# Patient Record
Sex: Female | Born: 1975 | Race: White | Hispanic: No | Smoking: Current every day smoker
Health system: Southern US, Community
[De-identification: ages and names within clinical notes are randomized; demographics above are authoritative.]

## PROBLEM LIST (undated history)

## (undated) DIAGNOSIS — F329 Major depressive disorder, single episode, unspecified: Secondary | ICD-10-CM

## (undated) DIAGNOSIS — F32A Depression, unspecified: Secondary | ICD-10-CM

## (undated) DIAGNOSIS — F4310 Post-traumatic stress disorder, unspecified: Secondary | ICD-10-CM

## (undated) HISTORY — DX: Major depressive disorder, single episode, unspecified: F32.9

## (undated) HISTORY — PX: CARPAL TUNNEL RELEASE: SHX101

## (undated) HISTORY — PX: ABDOMINAL HYSTERECTOMY: SHX81

## (undated) HISTORY — DX: Depression, unspecified: F32.A

## (undated) HISTORY — DX: Post-traumatic stress disorder, unspecified: F43.10

---

## 2000-11-30 ENCOUNTER — Ambulatory Visit (HOSPITAL_COMMUNITY): Admission: RE | Admit: 2000-11-30 | Discharge: 2000-11-30 | Payer: Self-pay | Admitting: Obstetrics and Gynecology

## 2000-11-30 ENCOUNTER — Encounter: Payer: Self-pay | Admitting: Obstetrics and Gynecology

## 2001-01-05 ENCOUNTER — Ambulatory Visit (HOSPITAL_COMMUNITY): Admission: RE | Admit: 2001-01-05 | Discharge: 2001-01-05 | Payer: Self-pay | Admitting: Obstetrics and Gynecology

## 2001-01-05 ENCOUNTER — Encounter: Payer: Self-pay | Admitting: Obstetrics and Gynecology

## 2001-04-20 ENCOUNTER — Emergency Department (HOSPITAL_COMMUNITY): Admission: EM | Admit: 2001-04-20 | Discharge: 2001-04-20 | Payer: Self-pay | Admitting: Emergency Medicine

## 2001-10-01 ENCOUNTER — Observation Stay (HOSPITAL_COMMUNITY): Admission: RE | Admit: 2001-10-01 | Discharge: 2001-10-01 | Payer: Self-pay | Admitting: Obstetrics and Gynecology

## 2002-02-04 ENCOUNTER — Encounter: Payer: Self-pay | Admitting: Obstetrics and Gynecology

## 2002-02-04 ENCOUNTER — Inpatient Hospital Stay (HOSPITAL_COMMUNITY): Admission: RE | Admit: 2002-02-04 | Discharge: 2002-02-05 | Payer: Self-pay | Admitting: Obstetrics and Gynecology

## 2002-08-07 ENCOUNTER — Encounter: Payer: Self-pay | Admitting: Internal Medicine

## 2002-08-07 ENCOUNTER — Ambulatory Visit (HOSPITAL_COMMUNITY): Admission: RE | Admit: 2002-08-07 | Discharge: 2002-08-07 | Payer: Self-pay | Admitting: Internal Medicine

## 2002-08-20 ENCOUNTER — Ambulatory Visit (HOSPITAL_COMMUNITY): Admission: RE | Admit: 2002-08-20 | Discharge: 2002-08-20 | Payer: Self-pay | Admitting: Orthopaedic Surgery

## 2002-08-20 ENCOUNTER — Encounter: Payer: Self-pay | Admitting: Orthopaedic Surgery

## 2002-11-18 ENCOUNTER — Ambulatory Visit: Admission: RE | Admit: 2002-11-18 | Discharge: 2002-11-18 | Payer: Self-pay | Admitting: Orthopedic Surgery

## 2002-11-18 ENCOUNTER — Encounter: Payer: Self-pay | Admitting: Orthopedic Surgery

## 2005-06-20 ENCOUNTER — Emergency Department (HOSPITAL_COMMUNITY): Admission: EM | Admit: 2005-06-20 | Discharge: 2005-06-20 | Payer: Self-pay | Admitting: Emergency Medicine

## 2008-02-11 ENCOUNTER — Emergency Department: Payer: Self-pay | Admitting: Emergency Medicine

## 2008-04-11 ENCOUNTER — Emergency Department: Payer: Self-pay | Admitting: Emergency Medicine

## 2008-04-22 ENCOUNTER — Emergency Department: Payer: Self-pay | Admitting: Emergency Medicine

## 2008-04-28 ENCOUNTER — Ambulatory Visit: Payer: Self-pay | Admitting: Orthopedic Surgery

## 2008-09-16 ENCOUNTER — Ambulatory Visit: Payer: Self-pay | Admitting: Gynecologic Oncology

## 2008-11-17 ENCOUNTER — Emergency Department: Payer: Self-pay | Admitting: Emergency Medicine

## 2009-07-14 ENCOUNTER — Emergency Department: Payer: Self-pay | Admitting: Emergency Medicine

## 2009-07-15 ENCOUNTER — Emergency Department: Payer: Self-pay | Admitting: Emergency Medicine

## 2009-11-20 ENCOUNTER — Emergency Department: Payer: Self-pay | Admitting: Emergency Medicine

## 2009-12-13 ENCOUNTER — Inpatient Hospital Stay: Payer: Self-pay | Admitting: Internal Medicine

## 2009-12-31 ENCOUNTER — Emergency Department: Payer: Self-pay | Admitting: Emergency Medicine

## 2010-01-08 ENCOUNTER — Emergency Department: Payer: Self-pay | Admitting: Emergency Medicine

## 2010-02-09 ENCOUNTER — Emergency Department: Payer: Self-pay | Admitting: Emergency Medicine

## 2010-02-15 ENCOUNTER — Emergency Department: Payer: Self-pay | Admitting: Emergency Medicine

## 2010-03-31 ENCOUNTER — Ambulatory Visit: Payer: Self-pay | Admitting: Internal Medicine

## 2010-04-06 ENCOUNTER — Ambulatory Visit: Payer: Self-pay | Admitting: Internal Medicine

## 2010-05-08 ENCOUNTER — Encounter: Payer: Self-pay | Admitting: Obstetrics & Gynecology

## 2011-06-10 ENCOUNTER — Emergency Department: Payer: Self-pay | Admitting: Emergency Medicine
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
## 2016-07-26 ENCOUNTER — Emergency Department (HOSPITAL_COMMUNITY)
Admission: EM | Admit: 2016-07-26 | Discharge: 2016-07-26 | Disposition: A | Discharge status: Home / Self Care | Payer: Self-pay | Attending: Emergency Medicine | Admitting: Emergency Medicine

## 2016-07-26 ENCOUNTER — Encounter (HOSPITAL_COMMUNITY): Payer: Self-pay | Admitting: Emergency Medicine

## 2016-07-26 DIAGNOSIS — Y92009 Unspecified place in unspecified non-institutional (private) residence as the place of occurrence of the external cause: Secondary | ICD-10-CM | POA: Insufficient documentation

## 2016-07-26 DIAGNOSIS — Y939 Activity, unspecified: Secondary | ICD-10-CM | POA: Insufficient documentation

## 2016-07-26 DIAGNOSIS — Z23 Encounter for immunization: Secondary | ICD-10-CM | POA: Insufficient documentation

## 2016-07-26 DIAGNOSIS — Y999 Unspecified external cause status: Secondary | ICD-10-CM | POA: Insufficient documentation

## 2016-07-26 DIAGNOSIS — T24212A Burn of second degree of left thigh, initial encounter: Secondary | ICD-10-CM | POA: Insufficient documentation

## 2016-07-26 DIAGNOSIS — X150XXA Contact with hot stove (kitchen), initial encounter: Secondary | ICD-10-CM | POA: Insufficient documentation

## 2016-07-26 DIAGNOSIS — F17200 Nicotine dependence, unspecified, uncomplicated: Secondary | ICD-10-CM | POA: Insufficient documentation

## 2016-07-26 DIAGNOSIS — T22232A Burn of second degree of left upper arm, initial encounter: Secondary | ICD-10-CM | POA: Insufficient documentation

## 2016-07-26 MED ORDER — TETANUS-DIPHTH-ACELL PERTUSSIS 5-2.5-18.5 LF-MCG/0.5 IM SUSP
0.5000 mL | Freq: Once | INTRAMUSCULAR | Status: AC
  Administered 2016-07-26: 0.5 mL via INTRAMUSCULAR
  Filled 2016-07-26: qty 0.5

## 2016-07-26 MED ORDER — DOUBLE ANTIBIOTIC 500-10000 UNIT/GM EX OINT
TOPICAL_OINTMENT | Freq: Once | CUTANEOUS | Status: AC
  Administered 2016-07-26: 1 via TOPICAL
  Filled 2016-07-26: qty 8

## 2016-07-26 MED ORDER — OXYCODONE-ACETAMINOPHEN 5-325 MG PO TABS: 1.0000 | ORAL_TABLET | ORAL | 0 refills | Status: DC | PRN

## 2016-07-26 MED ORDER — OXYCODONE-ACETAMINOPHEN 5-325 MG PO TABS
2.0000 | ORAL_TABLET | Freq: Once | ORAL | Status: AC
  Administered 2016-07-26: 2 via ORAL
  Filled 2016-07-26: qty 2

## 2016-07-26 MED ORDER — DOUBLE ANTIBIOTIC 500-10000 UNIT/GM EX OINT: 1.0000 "application " | TOPICAL_OINTMENT | Freq: Two times a day (BID) | CUTANEOUS | 0 refills | Status: DC
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
## 2018-01-15 ENCOUNTER — Emergency Department (HOSPITAL_COMMUNITY): Payer: Self-pay

## 2018-01-15 ENCOUNTER — Encounter (HOSPITAL_COMMUNITY): Payer: Self-pay | Admitting: *Deleted

## 2018-01-15 ENCOUNTER — Emergency Department (HOSPITAL_COMMUNITY)
Admission: EM | Admit: 2018-01-15 | Discharge: 2018-01-16 | Disposition: A | Discharge status: Home / Self Care | Payer: Self-pay | Attending: Emergency Medicine | Admitting: Emergency Medicine

## 2018-01-15 DIAGNOSIS — F17210 Nicotine dependence, cigarettes, uncomplicated: Secondary | ICD-10-CM | POA: Insufficient documentation

## 2018-01-15 DIAGNOSIS — Y998 Other external cause status: Secondary | ICD-10-CM | POA: Insufficient documentation

## 2018-01-15 DIAGNOSIS — W010XXA Fall on same level from slipping, tripping and stumbling without subsequent striking against object, initial encounter: Secondary | ICD-10-CM | POA: Insufficient documentation

## 2018-01-15 DIAGNOSIS — S93602A Unspecified sprain of left foot, initial encounter: Secondary | ICD-10-CM | POA: Insufficient documentation

## 2018-01-15 DIAGNOSIS — Y92019 Unspecified place in single-family (private) house as the place of occurrence of the external cause: Secondary | ICD-10-CM | POA: Insufficient documentation

## 2018-01-15 DIAGNOSIS — Y9301 Activity, walking, marching and hiking: Secondary | ICD-10-CM | POA: Insufficient documentation

## 2018-01-15 DIAGNOSIS — R200 Anesthesia of skin: Secondary | ICD-10-CM | POA: Insufficient documentation

## 2018-01-15 MED ORDER — ONDANSETRON HCL 4 MG PO TABS
4.0000 mg | ORAL_TABLET | Freq: Once | ORAL | Status: AC
  Administered 2018-01-15: 4 mg via ORAL
  Filled 2018-01-15: qty 1

## 2018-01-15 MED ORDER — IBUPROFEN 400 MG PO TABS
400.0000 mg | ORAL_TABLET | Freq: Once | ORAL | Status: AC
  Administered 2018-01-15: 400 mg via ORAL
  Filled 2018-01-15: qty 1

## 2018-01-15 MED ORDER — IBUPROFEN 600 MG PO TABS: 600.0000 mg | ORAL_TABLET | Freq: Four times a day (QID) | ORAL | 0 refills | Status: DC

## 2018-01-15 MED ORDER — HYDROCODONE-ACETAMINOPHEN 5-325 MG PO TABS
2.0000 | ORAL_TABLET | Freq: Once | ORAL | Status: AC
  Administered 2018-01-15: 2 via ORAL
  Filled 2018-01-15: qty 2

## 2018-01-15 MED ORDER — TRAMADOL HCL 50 MG PO TABS: 50.0000 mg | ORAL_TABLET | Freq: Four times a day (QID) | ORAL | 0 refills | Status: DC | PRN
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
## 2018-01-16 NOTE — Discharge Instructions (Addendum)
Your xray is negative for fracture or dislocation. Please see Dr Romeo Apple for evaluation if not improving. Use the  ACE bandage and canvas shoe for the next 5 to 7 days. Use ibuprofen with each meal and bedtime. Use ultram for more severe pain.

## 2018-06-12 ENCOUNTER — Encounter: Payer: Self-pay | Admitting: Family Medicine

## 2018-06-12 ENCOUNTER — Other Ambulatory Visit: Payer: Self-pay

## 2018-06-12 DIAGNOSIS — Z90710 Acquired absence of both cervix and uterus: Secondary | ICD-10-CM | POA: Insufficient documentation

## 2018-06-12 DIAGNOSIS — Z9889 Other specified postprocedural states: Secondary | ICD-10-CM | POA: Insufficient documentation

## 2018-06-12 DIAGNOSIS — Z9882 Breast implant status: Secondary | ICD-10-CM | POA: Insufficient documentation

## 2018-06-12 DIAGNOSIS — Z9851 Tubal ligation status: Secondary | ICD-10-CM | POA: Insufficient documentation

## 2018-06-12 DIAGNOSIS — G43909 Migraine, unspecified, not intractable, without status migrainosus: Secondary | ICD-10-CM | POA: Insufficient documentation

## 2018-06-14 ENCOUNTER — Other Ambulatory Visit: Payer: Self-pay

## 2018-06-14 ENCOUNTER — Ambulatory Visit: Payer: BLUE CROSS/BLUE SHIELD | Admitting: Family Medicine

## 2018-06-14 ENCOUNTER — Encounter: Payer: Self-pay | Admitting: Family Medicine

## 2018-06-14 VITALS — BP 108/72 | HR 65 | Temp 98.0°F | Ht 63.0 in | Wt 144.0 lb

## 2018-06-14 DIAGNOSIS — R5383 Other fatigue: Secondary | ICD-10-CM

## 2018-06-14 DIAGNOSIS — G8929 Other chronic pain: Secondary | ICD-10-CM | POA: Diagnosis not present

## 2018-06-14 DIAGNOSIS — M2550 Pain in unspecified joint: Secondary | ICD-10-CM | POA: Diagnosis not present

## 2018-06-14 DIAGNOSIS — Z7689 Persons encountering health services in other specified circumstances: Secondary | ICD-10-CM | POA: Diagnosis not present

## 2018-06-14 DIAGNOSIS — K047 Periapical abscess without sinus: Secondary | ICD-10-CM

## 2018-06-14 MED ORDER — CLINDAMYCIN HCL 300 MG PO CAPS: 300.0000 mg | ORAL_CAPSULE | Freq: Three times a day (TID) | ORAL | 0 refills | Status: AC

## 2018-06-14 MED ORDER — NAPROXEN 500 MG PO TABS: 500.0000 mg | ORAL_TABLET | Freq: Two times a day (BID) | ORAL | 1 refills | Status: DC

## 2018-06-14 NOTE — Patient Instructions (Signed)
Joint Pain  Joint pain can be caused by many things. It is likely to go away if you follow instructions from your doctor for taking care of yourself at home. Sometimes, you may need more treatment. Follow these instructions at home: Managing pain, stiffness, and swelling   If told, put ice on the painful area. ? Put ice in a plastic bag. ? Place a towel between your skin and the bag. ? Leave the ice on for 20 minutes, 2-3 times a day.  If told, put heat on the painful area. Do this as often as told by your doctor. Use the heat source that your doctor recommends, such as a moist heat pack or a heating pad. ? Place a towel between your skin and the heat source. ? Leave the heat on for 20-30 minutes. ? Take off the heat if your skin gets bright red. This is especially important if you are unable to feel pain, heat, or cold. You may have a greater risk of getting burned.  Move your fingers or toes below the painful joint often. This helps with stiffness and swelling.  If possible, raise (elevate) the painful joint above the level of your heart while you are sitting or lying down. To do this, try putting a few pillows under the painful joint. Activity  Rest the painful joint for as long as told. Do not do things that cause pain or make your pain worse.  Begin exercising or stretching the affected area, as told by your doctor. Ask your doctor what types of exercise are safe for you. If you have an elastic bandage, sling, or splint:  Wear the device as told by your doctor. Take it off only as told by your doctor.  Loosen the device if your fingers or toes below the joint: ? Tingle. ? Lose feeling (get numb). ? Get cold and blue.  Keep the device clean.  Ask your doctor if you should take off the device before bathing. You may need to cover it with a watertight covering when you take a bath or a shower. General instructions  Take over-the-counter and prescription medicines only as told  by your doctor.  Do not use any products that contain nicotine or tobacco. These include cigarettes and e-cigarettes. If you need help quitting, ask your doctor.  Keep all follow-up visits as told by your doctor. This is important. Contact a doctor if:  You have pain that gets worse and does not get better with medicine.  Your joint pain does not get better in 3 days.  You have more bruising or swelling.  You have a fever.  You lose 10 lb (4.5 kg) or more without trying. Get help right away if:  You cannot move the joint.  Your fingers or toes tingle, lose feeling, or get cold and blue.  You have a fever along with a joint that is red, warm, and swollen. Summary  Joint pain can be caused by many things. It often goes away if you follow instructions from your doctor for taking care of yourself at home.  Rest the painful joint for as long as told. Do not do things that cause pain or make your pain worse.  Take over-the-counter and prescription medicines only as told by your doctor. This information is not intended to replace advice given to you by your health care provider. Make sure you discuss any questions you have with your health care provider. Document Released: 03/23/2009 Document Revised: 01/18/2017 Document   Reviewed: 01/18/2017 [employer] Interactive Patient Education  2019 [employer]  

## 2018-06-14 NOTE — Progress Notes (Signed)
Subjective:  Patient ID: Melanie Craig, female    DOB: Jan 02, 1976, 43 y.o.   MRN: [id number]  Chief Complaint:  Establish Care and Pain in joints of legs and hands   HPI: Melanie Craig is a 43 y.o. female presenting on 06/14/2018 for Establish Care and Pain in joints of legs and hands   1. Encounter to establish care  Pt presents today to establish care. Pt has not seen a PCP in 5 years, states last saw Dr. Clemmie Krill in [place]. States she does see psychiatry on a regular basis for her anxiety, depression, and PTSD. States she has been doing fairly well overall. States she is having ongoing issues with multiple joint pain. States this started at least 2-3 years ago. She states she has not seen anyone for this problem.    2. Other fatigue  Daily fatigue, states ongoing for several years. States it has become worse over the last several months. States she feels as if she could sleep all day. She states she sleeps at least 8-9 hours per night and still feels tired.    3. Chronic pain of multiple joints  Pt states she has pain in her knees, fingers, toes, and feet. She states this started at least 2-3 years ago and is getting worse. She has fatigue with the pain. States she does have swelling of her knees and ankles at times, but not always. States she is very stiff upon awakening and the pain increases throughout the day. She denies fever, chills, weakness, loss of function, or weakness. Pt states she has been taking motirn without relief of symptoms.    4. Dental abscess  Pt states she has an abscessed tooth. States this started several days ago and is getting worse. States she has not taken anything for this. States she is planning on seeing her dentist within the next week. Denies fever or chills.       Relevant past medical, surgical, family, and social history reviewed and updated as indicated.  Allergies and medications reviewed and updated.   Past Medical History:  Diagnosis  Date  . Depression   . PTSD (post-traumatic stress disorder)     Past Surgical History:  Procedure Laterality Date  . ABDOMINAL HYSTERECTOMY     complete  . CARPAL TUNNEL RELEASE      Social History   Socioeconomic History  . Marital status: Divorced    Spouse name: Not on file  . Number of children: Not on file  . Years of education: Not on file  . Highest education level: Not on file  Occupational History  . Not on file  Social Needs  . Financial resource strain: Not on file  . Food insecurity:    Worry: Not on file    Inability: Not on file  . Transportation needs:    Medical: Not on file    Non-medical: Not on file  Tobacco Use  . Smoking status: Current Every Day Smoker    Packs/day: 0.50    Years: 20.00    Pack years: 10.00    Types: Cigarettes  . Smokeless tobacco: Never Used  Substance and Sexual Activity  . Alcohol use: No  . Drug use: No  . Sexual activity: Not on file  Lifestyle  . Physical activity:    Days per week: Not on file    Minutes per session: Not on file  . Stress: Not on file  Relationships  . Social connections:  Talks on phone: Not on file    Gets together: Not on file    Attends religious service: Not on file    Active member of club or organization: Not on file    Attends meetings of clubs or organizations: Not on file    Relationship status: Not on file  . Intimate partner violence:    Fear of current or ex partner: Not on file    Emotionally abused: Not on file    Physically abused: Not on file    Forced sexual activity: Not on file  Other Topics Concern  . Not on file  Social History Narrative  . Not on file    Outpatient Encounter Medications as of 06/14/2018  Medication Sig  . ARIPiprazole (ABILIFY) 2 MG tablet Take 2 mg by mouth daily.  Marland Kitchen gabapentin (NEURONTIN) 600 MG tablet Take 600 mg by mouth 4 (four) times daily.  . prazosin (MINIPRESS) 1 MG capsule Take 1 mg by mouth at bedtime.  . [DISCONTINUED] ibuprofen  (ADVIL,MOTRIN) 600 MG tablet Take 1 tablet (600 mg total) by mouth 4 (four) times daily.  . clindamycin (CLEOCIN) 300 MG capsule Take 1 capsule (300 mg total) by mouth 3 (three) times daily for 7 days.  . naproxen (NAPROSYN) 500 MG tablet Take 1 tablet (500 mg total) by mouth 2 (two) times daily with a meal.  . [DISCONTINUED] clonazePAM (KLONOPIN) 0.5 MG tablet Take 0.5 mg by mouth 3 (three) times daily as needed.  . [DISCONTINUED] naproxen (NAPROSYN) 500 MG tablet Take 500 mg by mouth 2 (two) times daily.  . [DISCONTINUED] oxyCODONE-acetaminophen (PERCOCET) 5-325 MG tablet Take 1 tablet by mouth every 4 (four) hours as needed for severe pain.  . [DISCONTINUED] polymixin-bacitracin (POLYSPORIN) 500-10000 UNIT/GM OINT ointment Apply 1 application topically 2 (two) times daily.  . [DISCONTINUED] traMADol (ULTRAM) 50 MG tablet Take 1 tablet (50 mg total) by mouth every 6 (six) hours as needed.   No facility-administered encounter medications on file as of 06/14/2018.     Allergies  Allergen Reactions  . Penicillins     Review of Systems  Constitutional: Positive for activity change, appetite change and fatigue. Negative for chills, fever and unexpected weight change.  HENT: Positive for dental problem.   Eyes: Negative for photophobia and visual disturbance.  Respiratory: Negative for cough, shortness of breath and wheezing.   Cardiovascular: Negative for chest pain and palpitations.  Endocrine: Positive for cold intolerance. Negative for heat intolerance, polydipsia, polyphagia and polyuria.  Musculoskeletal: Positive for arthralgias and joint swelling. Negative for back pain, gait problem, myalgias, neck pain and neck stiffness.  Neurological: Negative for dizziness, tremors, seizures, syncope, facial asymmetry, speech difficulty, weakness, light-headedness, numbness and headaches.  Psychiatric/Behavioral: Negative for confusion.  All other systems reviewed and are negative.        Objective:  BP 108/72   Pulse 65   Temp 98 F (36.7 C) (Oral)   Ht 5' 3" (1.6 m)   Wt 144 lb (65.3 kg)   BMI 25.51 kg/m    Wt Readings from Last 3 Encounters:  06/14/18 144 lb (65.3 kg)  01/15/18 135 lb (61.2 kg)  07/26/16 125 lb (56.7 kg)    Physical Exam Vitals signs and nursing note reviewed.  Constitutional:      General: She is not in acute distress.    Appearance: Normal appearance. She is not ill-appearing or toxic-appearing.  HENT:     Head: Normocephalic and atraumatic.     Jaw: There is  normal jaw occlusion.     Right Ear: Hearing, tympanic membrane, ear canal and external ear normal.     Left Ear: Hearing, tympanic membrane, ear canal and external ear normal.     Nose: Nose normal.     Mouth/Throat:     Mouth: Mucous membranes are moist.     Dentition: Abnormal dentition. Has dentures. Dental tenderness, gingival swelling, dental caries and dental abscesses present.     Tongue: No lesions. Tongue does not protrude in midline.     Pharynx: Oropharynx is clear. No pharyngeal swelling, oropharyngeal exudate, posterior oropharyngeal erythema or uvula swelling.     Tonsils: No tonsillar exudate or tonsillar abscesses.   Eyes:     Extraocular Movements: Extraocular movements intact.     Conjunctiva/sclera: Conjunctivae normal.     Pupils: Pupils are equal, round, and reactive to light.  Musculoskeletal:     Right knee: She exhibits normal range of motion, no swelling, no effusion, no ecchymosis, no deformity, no laceration, no erythema, normal alignment, no LCL laxity and normal patellar mobility. Tenderness found.     Left knee: She exhibits normal range of motion, no swelling, no effusion, no ecchymosis, no deformity, no laceration, no erythema, normal alignment, no LCL laxity, normal patellar mobility, no bony tenderness, normal meniscus and no MCL laxity. Tenderness found. No medial joint line, no lateral joint line, no MCL, no LCL and no patellar tendon  tenderness noted.     Right ankle: Normal.     Left ankle: Normal.  Neurological:     Mental Status: She is alert.  Psychiatric:        Attention and Perception: Attention and perception normal.        Mood and Affect: Mood and affect normal.        Speech: Speech normal.        Behavior: Behavior is cooperative.        Thought Content: Thought content normal.        Cognition and Memory: Cognition and memory normal.        Judgment: Judgment normal.     No results found for this or any previous visit.     Pertinent labs & imaging results that were available during my care of the patient were reviewed by me and considered in my medical decision making.  Assessment & Plan:  Arista was seen today for establish care and pain in joints of legs and hands.  Diagnoses and all orders for this visit:  Encounter to establish care -     HIV Antibody (routine testing w rflx)  Other fatigue Labs pending. Sleep hygiene discussed.  -     CBC with Differential/Platelet -     CMP14+EGFR -     TSH -     VITAMIN D 25 Hydroxy (Vit-D Deficiency, Fractures) -     Vitamin B12 -     Arthritis Panel  Chronic pain of multiple joints Labs pending. Offered PT, declined. Medications as prescribed. Will speak with psychiatry to see if addition of Lyrica or Cymbalta will be beneficial.  -     CBC with Differential/Platelet -     CMP14+EGFR -     VITAMIN D 25 Hydroxy (Vit-D Deficiency, Fractures) -     Vitamin B12 -     Arthritis Panel -     naproxen (NAPROSYN) 500 MG tablet; Take 1 tablet (500 mg total) by mouth 2 (two) times daily with a meal.  Dental abscess Medications as  prescribed. Make a dental appointment ASAP. -     naproxen (NAPROSYN) 500 MG tablet; Take 1 tablet (500 mg total) by mouth 2 (two) times daily with a meal. -     clindamycin (CLEOCIN) 300 MG capsule; Take 1 capsule (300 mg total) by mouth 3 (three) times daily for 7 days.     Continue all other maintenance  medications.  Follow up plan: Return in about 4 weeks (around 07/12/2018), or if symptoms worsen or fail to improve.  Educational handout given for joint pain  The above assessment and management plan was discussed with the patient. The patient verbalized understanding of and has agreed to the management plan. Patient is aware to call the clinic if symptoms persist or worsen. Patient is aware when to return to the clinic for a follow-up visit. Patient educated on when it is appropriate to go to the emergency department.   Monia Pouch, FNP-C [facility] [phone]

## 2018-06-15 ENCOUNTER — Telehealth: Payer: Self-pay | Admitting: Family Medicine

## 2018-06-15 LAB — ARTHRITIS PANEL
BASOS: 1 %
Basophils Absolute: 0 10*3/uL (ref 0.0–0.2)
EOS (ABSOLUTE): 0.2 10*3/uL (ref 0.0–0.4)
EOS: 4 %
HEMATOCRIT: 42.6 % (ref 34.0–46.6)
HEMOGLOBIN: 14.3 g/dL (ref 11.1–15.9)
IMMATURE GRANS (ABS): 0 10*3/uL (ref 0.0–0.1)
Immature Granulocytes: 1 %
LYMPHS ABS: 1.6 10*3/uL (ref 0.7–3.1)
Lymphs: 37 %
MCH: 30.5 pg (ref 26.6–33.0)
MCHC: 33.6 g/dL (ref 31.5–35.7)
MCV: 91 fL (ref 79–97)
MONOCYTES: 7 %
Monocytes Absolute: 0.3 10*3/uL (ref 0.1–0.9)
NEUTROS ABS: 2.2 10*3/uL (ref 1.4–7.0)
Neutrophils: 50 %
Platelets: 203 10*3/uL (ref 150–450)
RBC: 4.69 x10E6/uL (ref 3.77–5.28)
RDW: 13.7 % (ref 11.7–15.4)
Rhuematoid fact SerPl-aCnc: <10 IU/mL (ref 0.0–13.9)
SED RATE: 33 mm/h — AB (ref 0–32)
Uric Acid: 4 mg/dL (ref 2.5–7.1)
WBC: 4.3 10*3/uL (ref 3.4–10.8)

## 2018-06-15 LAB — CMP14+EGFR
ALBUMIN: 4.3 g/dL (ref 3.8–4.8)
ALT: 102 IU/L — ABNORMAL HIGH (ref 0–32)
AST: 52 IU/L — ABNORMAL HIGH (ref 0–40)
Albumin/Globulin Ratio: 1.5 (ref 1.2–2.2)
Alkaline Phosphatase: 211 IU/L — ABNORMAL HIGH (ref 39–117)
BUN / CREAT RATIO: 10 (ref 9–23)
BUN: 10 mg/dL (ref 6–24)
Bilirubin Total: <0.2 mg/dL (ref 0.0–1.2)
CALCIUM: 9.6 mg/dL (ref 8.7–10.2)
CO2: 26 mmol/L (ref 20–29)
Chloride: 101 mmol/L (ref 96–106)
Creatinine, Ser: 0.97 mg/dL (ref 0.57–1.00)
GFR calc Af Amer: 83 mL/min/{1.73_m2} (ref >59)
GFR, EST NON AFRICAN AMERICAN: 72 mL/min/{1.73_m2} (ref >59)
GLOBULIN, TOTAL: 2.9 g/dL (ref 1.5–4.5)
Glucose: 99 mg/dL (ref 65–99)
POTASSIUM: 4.5 mmol/L (ref 3.5–5.2)
Sodium: 142 mmol/L (ref 134–144)
TOTAL PROTEIN: 7.2 g/dL (ref 6.0–8.5)

## 2018-06-15 LAB — VITAMIN B12: Vitamin B-12: 503 pg/mL (ref 232–1245)

## 2018-06-15 LAB — HIV ANTIBODY (ROUTINE TESTING W REFLEX): HIV Screen 4th Generation wRfx: NONREACTIVE

## 2018-06-15 LAB — TSH: TSH: 3.35 u[IU]/mL (ref 0.450–4.500)

## 2018-06-15 LAB — VITAMIN D 25 HYDROXY (VIT D DEFICIENCY, FRACTURES): VIT D 25 HYDROXY: 30 ng/mL (ref 30.0–100.0)

## 2018-06-15 NOTE — Telephone Encounter (Signed)
Patient informed that results have not been reviewed and we will contact her when they have

## 2018-06-18 ENCOUNTER — Telehealth: Payer: Self-pay | Admitting: Family Medicine

## 2018-06-18 NOTE — Telephone Encounter (Signed)
Appt made to be seen on 06/26/2018 with Rakes at 9:20 am to discuss labs and pain

## 2018-06-19 NOTE — Telephone Encounter (Signed)
FYI, explained to patient that lab work was all normal and that you wanted to see her for followup.  Explained we could not call anything in for pain or fatigue until she was seen and evaluated again.

## 2018-06-26 ENCOUNTER — Ambulatory Visit: Payer: BLUE CROSS/BLUE SHIELD | Admitting: Family Medicine

## 2018-06-26 ENCOUNTER — Encounter: Payer: Self-pay | Admitting: Family Medicine

## 2018-06-26 VITALS — BP 112/69 | HR 85 | Temp 98.6°F | Ht 63.0 in | Wt 143.0 lb

## 2018-06-26 DIAGNOSIS — G8929 Other chronic pain: Secondary | ICD-10-CM | POA: Diagnosis not present

## 2018-06-26 DIAGNOSIS — R748 Abnormal levels of other serum enzymes: Secondary | ICD-10-CM

## 2018-06-26 DIAGNOSIS — M2550 Pain in unspecified joint: Secondary | ICD-10-CM

## 2018-06-26 MED ORDER — DULOXETINE HCL 60 MG PO CPEP: 60.0000 mg | ORAL_CAPSULE | Freq: Every day | ORAL | 3 refills | Status: DC

## 2018-06-26 MED ORDER — DULOXETINE HCL 30 MG PO CPEP: 30.0000 mg | ORAL_CAPSULE | Freq: Every day | ORAL | 0 refills | Status: DC

## 2018-06-26 NOTE — Progress Notes (Signed)
Subjective:  Patient ID: Melanie Craig, female    DOB: 01/16/1976, 43 y.o.   MRN: [id number]  Chief Complaint:  Discuss labwork   HPI: Melanie Craig is a 43 y.o. female presenting on 06/26/2018 for Discuss labwork   1. Elevated liver enzymes  PT presents today to discuss lab work. Pts Alk Phos was 211, ALT 102, AST 52. Pt denies abdominal pain, icterus, or confusion. States she continues to have multiple joint pain and is taking Tylenol regularly for the pain. States she does not drink alcohol on a regular basis.   2. Chronic pain of multiple joints  Ongoing for several months. States she spoke with her psychiatrist as discussed and they are ok with Lyrica or Cymbalta for treatment.      Relevant past medical, surgical, family, and social history reviewed and updated as indicated.  Allergies and medications reviewed and updated.   Past Medical History:  Diagnosis Date  . Depression   . PTSD (post-traumatic stress disorder)     Past Surgical History:  Procedure Laterality Date  . ABDOMINAL HYSTERECTOMY     complete  . CARPAL TUNNEL RELEASE      Social History   Socioeconomic History  . Marital status: Divorced    Spouse name: Not on file  . Number of children: Not on file  . Years of education: Not on file  . Highest education level: Not on file  Occupational History  . Not on file  Social Needs  . Financial resource strain: Not on file  . Food insecurity:    Worry: Not on file    Inability: Not on file  . Transportation needs:    Medical: Not on file    Non-medical: Not on file  Tobacco Use  . Smoking status: Current Every Day Smoker    Packs/day: 0.50    Years: 20.00    Pack years: 10.00    Types: Cigarettes  . Smokeless tobacco: Never Used  Substance and Sexual Activity  . Alcohol use: No  . Drug use: No  . Sexual activity: Not on file  Lifestyle  . Physical activity:    Days per week: Not on file    Minutes per session: Not on file  .  Stress: Not on file  Relationships  . Social connections:    Talks on phone: Not on file    Gets together: Not on file    Attends religious service: Not on file    Active member of club or organization: Not on file    Attends meetings of clubs or organizations: Not on file    Relationship status: Not on file  . Intimate partner violence:    Fear of current or ex partner: Not on file    Emotionally abused: Not on file    Physically abused: Not on file    Forced sexual activity: Not on file  Other Topics Concern  . Not on file  Social History Narrative  . Not on file    Outpatient Encounter Medications as of 06/26/2018  Medication Sig  . ARIPiprazole (ABILIFY) 2 MG tablet Take 2 mg by mouth daily.  Marland Kitchen gabapentin (NEURONTIN) 600 MG tablet Take 600 mg by mouth 4 (four) times daily.  . naproxen (NAPROSYN) 500 MG tablet Take 1 tablet (500 mg total) by mouth 2 (two) times daily with a meal.  . prazosin (MINIPRESS) 1 MG capsule Take 1 mg by mouth at bedtime.  . DULoxetine (CYMBALTA) 30  MG capsule Take 1 capsule (30 mg total) by mouth daily for 7 days.  . DULoxetine (CYMBALTA) 60 MG capsule Take 1 capsule (60 mg total) by mouth daily for 30 days.   No facility-administered encounter medications on file as of 06/26/2018.     Allergies  Allergen Reactions  . Penicillins     Review of Systems  Constitutional: Positive for fatigue. Negative for chills, fever and unexpected weight change.  Respiratory: Negative for cough and shortness of breath.   Cardiovascular: Negative for chest pain and palpitations.  Gastrointestinal: Negative for abdominal distention, abdominal pain, nausea and vomiting.  Musculoskeletal: Positive for arthralgias (multiple joints, chronic). Negative for gait problem, joint swelling and myalgias.  Skin: Negative for color change and rash.  Neurological: Negative for weakness and numbness.  Hematological: Negative for adenopathy. Does not bruise/bleed easily.    Psychiatric/Behavioral: Negative for confusion.  All other systems reviewed and are negative.       Objective:  BP 112/69   Pulse 85   Temp 98.6 F (37 C) (Oral)   Ht _0  (1.6 m)   Wt 143 lb (64.9 kg)   BMI 25.33 kg/m    Wt Readings from Last 3 Encounters:  06/26/18 143 lb (64.9 kg)  06/14/18 144 lb (65.3 kg)  01/15/18 135 lb (61.2 kg)    Physical Exam Vitals signs and nursing note reviewed.  Constitutional:      General: She is not in acute distress.    Appearance: Normal appearance. She is well-developed. She is not ill-appearing, toxic-appearing or diaphoretic.  HENT:     Head: Normocephalic and atraumatic.  Eyes:     Conjunctiva/sclera: Conjunctivae normal.     Pupils: Pupils are equal, round, and reactive to light.  Neck:     Musculoskeletal: Normal range of motion and neck supple.     Thyroid: No thyromegaly.     Trachea: No tracheal deviation.  Cardiovascular:     Rate and Rhythm: Normal rate and regular rhythm.     Heart sounds: Normal heart sounds. No murmur. No friction rub. No gallop.   Pulmonary:     Effort: Pulmonary effort is normal. No respiratory distress.     Breath sounds: Normal breath sounds. No wheezing.  Abdominal:     General: Bowel sounds are normal. There is no distension.     Palpations: Abdomen is soft.     Tenderness: There is no abdominal tenderness.  Musculoskeletal: Normal range of motion.  Skin:    General: Skin is warm and dry.     Capillary Refill: Capillary refill takes less than 2 seconds.     Coloration: Skin is not jaundiced.     Findings: No rash.  Neurological:     General: No focal deficit present.     Mental Status: She is alert and oriented to person, place, and time.  Psychiatric:        Behavior: Behavior normal. Behavior is cooperative.        Thought Content: Thought content normal.        Judgment: Judgment normal.     Results for orders placed or performed in visit on 06/14/18  CMP14+EGFR  Result  Value Ref Range   Glucose 99 65 - 99 mg/dL   BUN 10 6 - 24 mg/dL   Creatinine, Ser 0.97 0.57 - 1.00 mg/dL   GFR calc non Af Amer 72 >59 mL/min/1.73   GFR calc Af Amer 83 >59 mL/min/1.73   BUN/Creatinine Ratio 10 9 -  23   Sodium 142 134 - 144 mmol/L   Potassium 4.5 3.5 - 5.2 mmol/L   Chloride 101 96 - 106 mmol/L   CO2 26 20 - 29 mmol/L   Calcium 9.6 8.7 - 10.2 mg/dL   Total Protein 7.2 6.0 - 8.5 g/dL   Albumin 4.3 3.8 - 4.8 g/dL   Globulin, Total 2.9 1.5 - 4.5 g/dL   Albumin/Globulin Ratio 1.5 1.2 - 2.2   Bilirubin Total <0.2 0.0 - 1.2 mg/dL   Alkaline Phosphatase 211 (H) 39 - 117 IU/L   AST 52 (H) 0 - 40 IU/L   ALT 102 (H) 0 - 32 IU/L  TSH  Result Value Ref Range   TSH 3.350 0.450 - 4.500 uIU/mL  VITAMIN D 25 Hydroxy (Vit-D Deficiency, Fractures)  Result Value Ref Range   Vit D, 25-Hydroxy 30.0 30.0 - 100.0 ng/mL  Vitamin B12  Result Value Ref Range   Vitamin B-12 503 232 - 1,245 pg/mL  Arthritis Panel  Result Value Ref Range   Uric Acid 4.0 2.5 - 7.1 mg/dL   Rhuematoid fact SerPl-aCnc <10.0 0.0 - 13.9 IU/mL   WBC 4.3 3.4 - 10.8 x10E3/uL   RBC 4.69 3.77 - 5.28 x10E6/uL   Hemoglobin 14.3 11.1 - 15.9 g/dL   Hematocrit 42.6 34.0 - 46.6 %   MCV 91 79 - 97 fL   MCH 30.5 26.6 - 33.0 pg   MCHC 33.6 31.5 - 35.7 g/dL   RDW 13.7 11.7 - 15.4 %   Platelets 203 150 - 450 x10E3/uL   Neutrophils 50 Not Estab. %   Lymphs 37 Not Estab. %   Monocytes 7 Not Estab. %   Eos 4 Not Estab. %   Basos 1 Not Estab. %   Neutrophils Absolute 2.2 1.4 - 7.0 x10E3/uL   Lymphocytes Absolute 1.6 0.7 - 3.1 x10E3/uL   Monocytes Absolute 0.3 0.1 - 0.9 x10E3/uL   EOS (ABSOLUTE) 0.2 0.0 - 0.4 x10E3/uL   Basophils Absolute 0.0 0.0 - 0.2 x10E3/uL   Immature Granulocytes 1 Not Estab. %   Immature Grans (Abs) 0.0 0.0 - 0.1 x10E3/uL   Sed Rate 33 (H) 0 - 32 mm/hr  HIV Antibody (routine testing w rflx)  Result Value Ref Range   HIV Screen 4th Generation wRfx Non Reactive Non Reactive       Pertinent  labs & imaging results that were available during my care of the patient were reviewed by me and considered in my medical decision making.  Assessment & Plan:  Melanie Craig was seen today for discuss labwork.  Diagnoses and all orders for this visit:  Elevated liver enzymes Fasting labs drawn today. If enzymes remain elevated, will order [place]. -     CMP14+EGFR -     Gamma GT  Chronic pain of multiple joints Limit tylenol intake. Medications as prescribed. Report any new or worsening symptoms.  -     DULoxetine (CYMBALTA) 30 MG capsule; Take 1 capsule (30 mg total) by mouth daily for 7 days. -     DULoxetine (CYMBALTA) 60 MG capsule; Take 1 capsule (60 mg total) by mouth daily for 30 days.     Continue all other maintenance medications.  Follow up plan: Return in about 4 weeks (around 07/24/2018), or if symptoms worsen or fail to improve, for joint pain.  Educational handout given for joint pain  The above assessment and management plan was discussed with the patient. The patient verbalized understanding of and has agreed to the  management plan. Patient is aware to call the clinic if symptoms persist or worsen. Patient is aware when to return to the clinic for a follow-up visit. Patient educated on when it is appropriate to go to the emergency department.   Monia Pouch, FNP-C [facility] [phone]

## 2018-06-26 NOTE — Patient Instructions (Signed)
Joint Pain  Joint pain can be caused by many things. It is likely to go away if you follow instructions from your doctor for taking care of yourself at home. Sometimes, you may need more treatment. Follow these instructions at home: Managing pain, stiffness, and swelling   If told, put ice on the painful area. ? Put ice in a plastic bag. ? Place a towel between your skin and the bag. ? Leave the ice on for 20 minutes, 2-3 times a day.  If told, put heat on the painful area. Do this as often as told by your doctor. Use the heat source that your doctor recommends, such as a moist heat pack or a heating pad. ? Place a towel between your skin and the heat source. ? Leave the heat on for 20-30 minutes. ? Take off the heat if your skin gets bright red. This is especially important if you are unable to feel pain, heat, or cold. You may have a greater risk of getting burned.  Move your fingers or toes below the painful joint often. This helps with stiffness and swelling.  If possible, raise (elevate) the painful joint above the level of your heart while you are sitting or lying down. To do this, try putting a few pillows under the painful joint. Activity  Rest the painful joint for as long as told. Do not do things that cause pain or make your pain worse.  Begin exercising or stretching the affected area, as told by your doctor. Ask your doctor what types of exercise are safe for you. If you have an elastic bandage, sling, or splint:  Wear the device as told by your doctor. Take it off only as told by your doctor.  Loosen the device if your fingers or toes below the joint: ? Tingle. ? Lose feeling (get numb). ? Get cold and blue.  Keep the device clean.  Ask your doctor if you should take off the device before bathing. You may need to cover it with a watertight covering when you take a bath or a shower. General instructions  Take over-the-counter and prescription medicines only as told  by your doctor.  Do not use any products that contain nicotine or tobacco. These include cigarettes and e-cigarettes. If you need help quitting, ask your doctor.  Keep all follow-up visits as told by your doctor. This is important. Contact a doctor if:  You have pain that gets worse and does not get better with medicine.  Your joint pain does not get better in 3 days.  You have more bruising or swelling.  You have a fever.  You lose 10 lb (4.5 kg) or more without trying. Get help right away if:  You cannot move the joint.  Your fingers or toes tingle, lose feeling, or get cold and blue.  You have a fever along with a joint that is red, warm, and swollen. Summary  Joint pain can be caused by many things. It often goes away if you follow instructions from your doctor for taking care of yourself at home.  Rest the painful joint for as long as told. Do not do things that cause pain or make your pain worse.  Take over-the-counter and prescription medicines only as told by your doctor. This information is not intended to replace advice given to you by your health care provider. Make sure you discuss any questions you have with your health care provider. Document Released: 03/23/2009 Document Revised: 01/18/2017 Document   Reviewed: 01/18/2017 [employer] Interactive Patient Education  2019 [employer]  

## 2018-06-27 LAB — CMP14+EGFR
ALT: 73 IU/L — AB (ref 0–32)
AST: 59 IU/L — AB (ref 0–40)
Albumin/Globulin Ratio: 1.6 (ref 1.2–2.2)
Albumin: 4 g/dL (ref 3.8–4.8)
Alkaline Phosphatase: 201 IU/L — ABNORMAL HIGH (ref 39–117)
BUN/Creatinine Ratio: 10 (ref 9–23)
BUN: 11 mg/dL (ref 6–24)
Bilirubin Total: 0.3 mg/dL (ref 0.0–1.2)
CHLORIDE: 99 mmol/L (ref 96–106)
CO2: 24 mmol/L (ref 20–29)
CREATININE: 1.11 mg/dL — AB (ref 0.57–1.00)
Calcium: 9.4 mg/dL (ref 8.7–10.2)
GFR calc non Af Amer: 61 mL/min/{1.73_m2} (ref >59)
GFR, EST AFRICAN AMERICAN: 71 mL/min/{1.73_m2} (ref >59)
Globulin, Total: 2.5 g/dL (ref 1.5–4.5)
Glucose: 87 mg/dL (ref 65–99)
Potassium: 4.2 mmol/L (ref 3.5–5.2)
Sodium: 139 mmol/L (ref 134–144)
TOTAL PROTEIN: 6.5 g/dL (ref 6.0–8.5)

## 2018-06-27 LAB — GAMMA GT: GGT: 68 IU/L — AB (ref 0–60)

## 2018-06-28 ENCOUNTER — Ambulatory Visit: Payer: BLUE CROSS/BLUE SHIELD | Admitting: Family Medicine

## 2018-06-28 NOTE — Addendum Note (Signed)
Addended by: Sonny Masters on: 06/28/2018 12:43 PM   Modules accepted: Orders

## 2018-06-30 ENCOUNTER — Telehealth: Payer: Self-pay | Admitting: Family Medicine

## 2018-06-30 NOTE — Telephone Encounter (Signed)
Patient aware of results and recommendation.

## 2018-07-03 ENCOUNTER — Ambulatory Visit (HOSPITAL_COMMUNITY): Payer: BLUE CROSS/BLUE SHIELD

## 2018-07-04 ENCOUNTER — Ambulatory Visit (INDEPENDENT_AMBULATORY_CARE_PROVIDER_SITE_OTHER): Payer: Self-pay | Admitting: Internal Medicine

## 2018-07-09 ENCOUNTER — Other Ambulatory Visit: Payer: Self-pay

## 2018-07-09 ENCOUNTER — Encounter (INDEPENDENT_AMBULATORY_CARE_PROVIDER_SITE_OTHER): Payer: Self-pay | Admitting: Internal Medicine

## 2018-07-09 ENCOUNTER — Ambulatory Visit (INDEPENDENT_AMBULATORY_CARE_PROVIDER_SITE_OTHER): Payer: BLUE CROSS/BLUE SHIELD | Admitting: Internal Medicine

## 2018-07-12 ENCOUNTER — Ambulatory Visit: Payer: BLUE CROSS/BLUE SHIELD | Admitting: Family Medicine

## 2018-07-23 ENCOUNTER — Ambulatory Visit (INDEPENDENT_AMBULATORY_CARE_PROVIDER_SITE_OTHER): Payer: BLUE CROSS/BLUE SHIELD | Admitting: Family Medicine

## 2018-07-23 ENCOUNTER — Encounter: Payer: Self-pay | Admitting: Family Medicine

## 2018-07-23 ENCOUNTER — Other Ambulatory Visit: Payer: Self-pay

## 2018-07-23 DIAGNOSIS — G8929 Other chronic pain: Secondary | ICD-10-CM | POA: Diagnosis not present

## 2018-07-23 DIAGNOSIS — M2550 Pain in unspecified joint: Secondary | ICD-10-CM | POA: Diagnosis not present

## 2018-07-23 MED ORDER — DULOXETINE HCL 60 MG PO CPEP: 60.0000 mg | ORAL_CAPSULE | Freq: Every day | ORAL | 3 refills | Status: DC

## 2018-07-23 NOTE — Progress Notes (Signed)
Virtual Visit via telephone Note Due to COVID-19, visit is conducted virtually and was requested by patient.  I connected with Melanie Craig on 07/23/18 at 0825 by telephone and verified that I am speaking with the correct person using two identifiers. Melanie Craig is currently located at home and family is currently with them during visit. The provider, Kari Baars, FNP is located in their office at time of visit.  I discussed the limitations, risks, security and privacy concerns of performing an evaluation and management service by telephone and the availability of in person appointments. I also discussed with the patient that there may be a patient responsible charge related to this service. The patient expressed understanding and agreed to proceed.  Subjective:  Patient ID: Melanie Craig, female    DOB: 02-Dec-1975, 43 y.o.   MRN: [id number]  Chief Complaint:  Pain   HPI: Melanie Craig is a 42 y.o. female presenting on 07/23/2018 for Pain   Pt reports mild improvement in joint pain after initiation of Cymbalta. Pt states she took the 30 mg for one week and then increased to 60 mg. States she is tolerating the medication without associated side effects. She states she does still have pain. States it is worse in her legs. The pain is stinging and burning, 8/10 in the mornings and evenings. States the pain is not as bad during the day. She denies other associated symptoms. Denies additional needs or concerns at this time.    Relevant past medical, surgical, family, and social history reviewed and updated as indicated.  Allergies and medications reviewed and updated.   Past Medical History:  Diagnosis Date  . Depression   . PTSD (post-traumatic stress disorder)     Past Surgical History:  Procedure Laterality Date  . ABDOMINAL HYSTERECTOMY     complete  . CARPAL TUNNEL RELEASE      Social History   Socioeconomic History  . Marital status: Divorced    Spouse name:  Not on file  . Number of children: Not on file  . Years of education: Not on file  . Highest education level: Not on file  Occupational History  . Not on file  Social Needs  . Financial resource strain: Not on file  . Food insecurity:    Worry: Not on file    Inability: Not on file  . Transportation needs:    Medical: Not on file    Non-medical: Not on file  Tobacco Use  . Smoking status: Current Every Day Smoker    Packs/day: 0.50    Years: 20.00    Pack years: 10.00    Types: Cigarettes  . Smokeless tobacco: Never Used  Substance and Sexual Activity  . Alcohol use: No  . Drug use: No  . Sexual activity: Not on file  Lifestyle  . Physical activity:    Days per week: Not on file    Minutes per session: Not on file  . Stress: Not on file  Relationships  . Social connections:    Talks on phone: Not on file    Gets together: Not on file    Attends religious service: Not on file    Active member of club or organization: Not on file    Attends meetings of clubs or organizations: Not on file    Relationship status: Not on file  . Intimate partner violence:    Fear of current or ex partner: Not on file    Emotionally  abused: Not on file    Physically abused: Not on file    Forced sexual activity: Not on file  Other Topics Concern  . Not on file  Social History Narrative  . Not on file    Outpatient Encounter Medications as of 07/23/2018  Medication Sig  . ARIPiprazole (ABILIFY) 2 MG tablet Take 2 mg by mouth daily.  . DULoxetine (CYMBALTA) 60 MG capsule Take 1 capsule (60 mg total) by mouth daily for 30 days.  Marland Kitchen gabapentin (NEURONTIN) 600 MG tablet Take 600 mg by mouth 4 (four) times daily.  . hydrOXYzine (VISTARIL) 50 MG capsule Take 50 mg by mouth 3 (three) times daily as needed.  . naproxen (NAPROSYN) 500 MG tablet Take 1 tablet (500 mg total) by mouth 2 (two) times daily with a meal.  . prazosin (MINIPRESS) 1 MG capsule Take 1 mg by mouth at bedtime.  . traZODone  (DESYREL) 50 MG tablet Take 50 mg by mouth. 1-2 tablets at bedtime  . [DISCONTINUED] DULoxetine (CYMBALTA) 30 MG capsule Take 1 capsule (30 mg total) by mouth daily for 7 days.  . [DISCONTINUED] DULoxetine (CYMBALTA) 60 MG capsule Take 1 capsule (60 mg total) by mouth daily for 30 days.   No facility-administered encounter medications on file as of 07/23/2018.     Allergies  Allergen Reactions  . Penicillins     Review of Systems  Constitutional: Negative for activity change, appetite change, chills, fatigue, fever and unexpected weight change.  Respiratory: Negative for cough and shortness of breath.   Cardiovascular: Negative for chest pain, palpitations and leg swelling.  Musculoskeletal: Positive for arthralgias and myalgias. Negative for back pain, gait problem, joint swelling, neck pain and neck stiffness.  Neurological: Negative for dizziness, weakness, light-headedness, numbness and headaches.  Psychiatric/Behavioral: Negative for confusion.  All other systems reviewed and are negative.        Observations/Objective: No vital signs or physical exam, this was a telephone or virtual health encounter.  Pt alert and oriented, answers all questions appropriately, and able to speak in full sentences.    Assessment and Plan: Melanie Craig was seen today for pain.  Diagnoses and all orders for this visit:  Chronic pain of multiple joints Will continue Cymbalta. Report any new or worsening symptoms. Follow up in 3 months.  -     DULoxetine (CYMBALTA) 60 MG capsule; Take 1 capsule (60 mg total) by mouth daily for 30 days.     Follow Up Instructions: Return in about 3 months (around 10/22/2018), or if symptoms worsen or fail to improve, for Chroinc joint pain.    I discussed the assessment and treatment plan with the patient. The patient was provided an opportunity to ask questions and all were answered. The patient agreed with the plan and demonstrated an understanding of the  instructions.   The patient was advised to call back or seek an in-person evaluation if the symptoms worsen or if the condition fails to improve as anticipated.  The above assessment and management plan was discussed with the patient. The patient verbalized understanding of and has agreed to the management plan. Patient is aware to call the clinic if symptoms persist or worsen. Patient is aware when to return to the clinic for a follow-up visit. Patient educated on when it is appropriate to go to the emergency department.    I provided 15 minutes of non-face-to-face time during this encounter. The call started at 0825. The call ended at 0840.   Kari Baars,  FNP-C [facility] [address] [place], [place] [address] [phone]

## 2018-09-21 ENCOUNTER — Other Ambulatory Visit: Payer: Self-pay | Admitting: Family Medicine

## 2018-10-11 ENCOUNTER — Ambulatory Visit (INDEPENDENT_AMBULATORY_CARE_PROVIDER_SITE_OTHER): Payer: BLUE CROSS/BLUE SHIELD | Admitting: Family Medicine

## 2018-10-11 ENCOUNTER — Encounter: Payer: Self-pay | Admitting: Family Medicine

## 2018-10-11 ENCOUNTER — Other Ambulatory Visit: Payer: Self-pay

## 2018-10-11 DIAGNOSIS — R110 Nausea: Secondary | ICD-10-CM

## 2018-10-11 DIAGNOSIS — K0889 Other specified disorders of teeth and supporting structures: Secondary | ICD-10-CM | POA: Diagnosis not present

## 2018-10-11 DIAGNOSIS — K047 Periapical abscess without sinus: Secondary | ICD-10-CM | POA: Diagnosis not present

## 2018-10-11 MED ORDER — ONDANSETRON HCL 4 MG PO TABS: 4.0000 mg | ORAL_TABLET | Freq: Three times a day (TID) | ORAL | 0 refills | Status: DC | PRN

## 2018-10-11 MED ORDER — CLINDAMYCIN HCL 300 MG PO CAPS: 300.0000 mg | ORAL_CAPSULE | Freq: Three times a day (TID) | ORAL | 0 refills | Status: AC

## 2018-10-11 MED ORDER — NAPROXEN 500 MG PO TABS: 500.0000 mg | ORAL_TABLET | Freq: Two times a day (BID) | ORAL | 1 refills | Status: DC

## 2018-10-11 NOTE — Progress Notes (Signed)
Virtual Visit via telephone Note Due to COVID-19, visit is conducted virtually and was requested by patient. This visit type was conducted due to national recommendations for restrictions regarding the COVID-19 Pandemic (e.g. social distancing) in an effort to limit this patient's exposure and mitigate transmission in our community. All issues noted in this document were discussed and addressed.  A physical exam was not performed with this format.   I connected with Melanie Craig on 10/11/18 at 1350 by telephone and verified that I am speaking with the correct person using two identifiers. Melanie Craig is currently located at home and family is currently with them during visit. The provider, Kari BaarsMichelle Rakes, FNP is located in their office at time of visit.  I discussed the limitations, risks, security and privacy concerns of performing an evaluation and management service by telephone and the availability of in person appointments. I also discussed with the patient that there may be a patient responsible charge related to this service. The patient expressed understanding and agreed to proceed.  Subjective:  Patient ID: Melanie Craig, female    DOB: Mar 13, 1976, 43 y.o.   MRN: [id number]  Chief Complaint:  Dental Pain and Facial Swelling   HPI: Melanie Craig is a 43 y.o. female presenting on 10/11/2018 for Dental Pain and Facial Swelling   Pt reports lower left sided dental pain with facial swelling. States she has a cavity and now the tooth has become abscessed. States the pain and swelling started a few days ago. Pt states she has been taking Naproxen with some relief of the pain. States she has drainage from the gum that is causing her to be nauseated. Pt states she has an appointment with her dentist in 2 weeks. States she can not tolerate the pain and needs something for the infection. States the pain is throbbing and 7/10.   Dental Pain  This is a new problem. The current episode  started in the past 7 days. The problem occurs constantly. The problem has been gradually worsening. The pain is at a severity of 7/10. The pain is moderate. Associated symptoms include facial pain and thermal sensitivity. Pertinent negatives include no difficulty swallowing, fever, oral bleeding or sinus pressure. She has tried NSAIDs for the symptoms. The treatment provided mild relief.     Relevant past medical, surgical, family, and social history reviewed and updated as indicated.  Allergies and medications reviewed and updated.   Past Medical History:  Diagnosis Date  . Depression   . PTSD (post-traumatic stress disorder)     Past Surgical History:  Procedure Laterality Date  . ABDOMINAL HYSTERECTOMY     complete  . CARPAL TUNNEL RELEASE      Social History   Socioeconomic History  . Marital status: Divorced    Spouse name: Not on file  . Number of children: Not on file  . Years of education: Not on file  . Highest education level: Not on file  Occupational History  . Not on file  Social Needs  . Financial resource strain: Not on file  . Food insecurity    Worry: Not on file    Inability: Not on file  . Transportation needs    Medical: Not on file    Non-medical: Not on file  Tobacco Use  . Smoking status: Current Every Day Smoker    Packs/day: 0.50    Years: 20.00    Pack years: 10.00    Types: Cigarettes  . Smokeless  tobacco: Never Used  Substance and Sexual Activity  . Alcohol use: No  . Drug use: No  . Sexual activity: Not on file  Lifestyle  . Physical activity    Days per week: Not on file    Minutes per session: Not on file  . Stress: Not on file  Relationships  . Social Herbalist on phone: Not on file    Gets together: Not on file    Attends religious service: Not on file    Active member of club or organization: Not on file    Attends meetings of clubs or organizations: Not on file    Relationship status: Not on file  . Intimate  partner violence    Fear of current or ex partner: Not on file    Emotionally abused: Not on file    Physically abused: Not on file    Forced sexual activity: Not on file  Other Topics Concern  . Not on file  Social History Narrative  . Not on file    Outpatient Encounter Medications as of 10/11/2018  Medication Sig  . amphetamine-dextroamphetamine (ADDERALL) 20 MG tablet   . ARIPiprazole (ABILIFY) 2 MG tablet Take 2 mg by mouth daily.  . buprenorphine (SUBUTEX) 8 MG SUBL SL tablet   . clindamycin (CLEOCIN) 300 MG capsule Take 1 capsule (300 mg total) by mouth 3 (three) times daily for 7 days.  . DULoxetine (CYMBALTA) 60 MG capsule Take 1 capsule (60 mg total) by mouth daily for 30 days.  Marland Kitchen gabapentin (NEURONTIN) 600 MG tablet Take 600 mg by mouth 4 (four) times daily.  . hydrOXYzine (VISTARIL) 50 MG capsule Take 50 mg by mouth 3 (three) times daily as needed.  . naproxen (NAPROSYN) 500 MG tablet Take 1 tablet (500 mg total) by mouth 2 (two) times daily with a meal.  . ondansetron (ZOFRAN) 4 MG tablet Take 1 tablet (4 mg total) by mouth every 8 (eight) hours as needed for nausea or vomiting.  . prazosin (MINIPRESS) 1 MG capsule Take 1 mg by mouth at bedtime.  . traZODone (DESYREL) 50 MG tablet Take 50 mg by mouth. 1-2 tablets at bedtime  . [DISCONTINUED] naproxen (NAPROSYN) 500 MG tablet Take 1 tablet (500 mg total) by mouth 2 (two) times daily with a meal.   No facility-administered encounter medications on file as of 10/11/2018.     Allergies  Allergen Reactions  . Penicillins     Review of Systems  Constitutional: Negative for activity change, appetite change, chills, fatigue, fever and unexpected weight change.  HENT: Positive for dental problem. Negative for sinus pressure.   Respiratory: Negative for shortness of breath.   Cardiovascular: Negative for chest pain and palpitations.  Gastrointestinal: Positive for nausea. Negative for abdominal pain and vomiting.   Neurological: Negative for dizziness, syncope, weakness, light-headedness and headaches.  Psychiatric/Behavioral: Negative for confusion.  All other systems reviewed and are negative.        Observations/Objective: No vital signs or physical exam, this was a telephone or virtual health encounter.  Pt alert and oriented, answers all questions appropriately, and able to speak in full sentences.    Assessment and Plan: Siham was seen today for dental pain and facial swelling.  Diagnoses and all orders for this visit:  Pain, dental Dental pain likely due to dental abscess. Symptomatic care discussed. Avoid hot and cold foods and beverages. Follow up with dentist as soon as possible. Medications as prescribed. Report any new  or worsening symptoms.  -     naproxen (NAPROSYN) 500 MG tablet; Take 1 tablet (500 mg total) by mouth 2 (two) times daily with a meal.  Dental abscess Reported symptoms consistent with dental abscess. Pt has allergy to PCN so will treat with below. Pt aware to take medications with food and to take a probiotic daily. Follow up with dentist as soon as possible.  -     clindamycin (CLEOCIN) 300 MG capsule; Take 1 capsule (300 mg total) by mouth 3 (three) times daily for 7 days.  Nausea in adult Nausea due to drainage from dental abscess. Bland diet. Report any new or worsening symptoms. Medications as prescribed and as needed. Report any new or worsening symptoms.  -     ondansetron (ZOFRAN) 4 MG tablet; Take 1 tablet (4 mg total) by mouth every 8 (eight) hours as needed for nausea or vomiting.     Follow Up Instructions: Return if symptoms worsen or fail to improve.    I discussed the assessment and treatment plan with the patient. The patient was provided an opportunity to ask questions and all were answered. The patient agreed with the plan and demonstrated an understanding of the instructions.   The patient was advised to call back or seek an in-person  evaluation if the symptoms worsen or if the condition fails to improve as anticipated.  The above assessment and management plan was discussed with the patient. The patient verbalized understanding of and has agreed to the management plan. Patient is aware to call the clinic if symptoms persist or worsen. Patient is aware when to return to the clinic for a follow-up visit. Patient educated on when it is appropriate to go to the emergency department.    I provided 15 minutes of non-face-to-face time during this encounter. The call started at 1350. The call ended at 1405. The other time was used for coordination of care.    Kari BaarsMichelle Rakes, FNP-C [facility] [address] [place], [place] [id number] [phone]

## 2018-10-11 NOTE — Patient Instructions (Signed)
See dentist ASAP.  Probiotics while taking antibiotics.

## 2018-11-22 ENCOUNTER — Telehealth: Payer: Self-pay | Admitting: Family Medicine

## 2018-11-22 NOTE — Telephone Encounter (Signed)
Patient states that cymbalta is not helping.  Appt made to revaluted on 8/12

## 2018-11-22 NOTE — Telephone Encounter (Signed)
Sounds good. She probably needs to go to pain management. Ask her if this is something she wants to do?

## 2018-11-28 ENCOUNTER — Encounter: Payer: Self-pay | Admitting: Family Medicine

## 2018-11-28 ENCOUNTER — Ambulatory Visit: Payer: BLUE CROSS/BLUE SHIELD | Admitting: Family Medicine

## 2018-11-28 ENCOUNTER — Other Ambulatory Visit: Payer: Self-pay

## 2018-11-28 VITALS — BP 101/67 | HR 63 | Temp 99.1°F | Ht 63.0 in | Wt 149.0 lb

## 2018-11-28 DIAGNOSIS — R202 Paresthesia of skin: Secondary | ICD-10-CM | POA: Diagnosis not present

## 2018-11-28 DIAGNOSIS — R748 Abnormal levels of other serum enzymes: Secondary | ICD-10-CM

## 2018-11-28 DIAGNOSIS — M2550 Pain in unspecified joint: Secondary | ICD-10-CM

## 2018-11-28 DIAGNOSIS — R5383 Other fatigue: Secondary | ICD-10-CM

## 2018-11-28 DIAGNOSIS — G629 Polyneuropathy, unspecified: Secondary | ICD-10-CM | POA: Insufficient documentation

## 2018-11-28 DIAGNOSIS — G8929 Other chronic pain: Secondary | ICD-10-CM

## 2018-11-28 MED ORDER — DULOXETINE HCL 60 MG PO CPEP: 120.0000 mg | ORAL_CAPSULE | Freq: Every day | ORAL | 3 refills | Status: DC

## 2018-11-28 NOTE — Patient Instructions (Signed)

## 2018-11-28 NOTE — Progress Notes (Signed)
Subjective:  Patient ID: Melanie Craig, female    DOB: 17-Jul-1975, 43 y.o.   MRN: [id number]  Patient Care Team: Baruch Gouty, FNP as PCP - General (Family Medicine)   Chief Complaint:  Medical Management of Chronic Issues (follow up Cymbalta and leg pain and fatigue)   HPI: Melanie Craig is a 43 y.o. female presenting on 11/28/2018 for Medical Management of Chronic Issues (follow up Cymbalta and leg pain and fatigue)   1. Chronic pain of multiple joints   2. Neuropathy   3. Paresthesia of both hands  Ongoing pain of knees, hands, wrists, and back. Does have shooting and burning pain in bilateral upper and lower extremities. Does have numbness and tingling in bilateral hands, no known injuries. Ongoing for several months. States Cymbalta is not helping with the pain and would like to try Lyrica. Discussed this could not be initiated due to other controlled substances and gabapentin use.    4. Elevated liver enzymes  On abdominal pain, confusion, weakness, or icterus.   5. Other fatigue  Ongoing for several months. Not improving despite sleeping at least 8 hours per day.       Relevant past medical, surgical, family, and social history reviewed and updated as indicated.  Allergies and medications reviewed and updated. Date reviewed: Chart in Epic.   Past Medical History:  Diagnosis Date  . Depression   . PTSD (post-traumatic stress disorder)     Past Surgical History:  Procedure Laterality Date  . ABDOMINAL HYSTERECTOMY     complete  . CARPAL TUNNEL RELEASE      Social History   Socioeconomic History  . Marital status: Divorced    Spouse name: Not on file  . Number of children: Not on file  . Years of education: Not on file  . Highest education level: Not on file  Occupational History  . Not on file  Social Needs  . Financial resource strain: Not on file  . Food insecurity    Worry: Not on file    Inability: Not on file  . Transportation needs   Medical: Not on file    Non-medical: Not on file  Tobacco Use  . Smoking status: Current Every Day Smoker    Packs/day: 0.50    Years: 20.00    Pack years: 10.00    Types: Cigarettes  . Smokeless tobacco: Never Used  Substance and Sexual Activity  . Alcohol use: No  . Drug use: No  . Sexual activity: Not on file  Lifestyle  . Physical activity    Days per week: Not on file    Minutes per session: Not on file  . Stress: Not on file  Relationships  . Social Herbalist on phone: Not on file    Gets together: Not on file    Attends religious service: Not on file    Active member of club or organization: Not on file    Attends meetings of clubs or organizations: Not on file    Relationship status: Not on file  . Intimate partner violence    Fear of current or ex partner: Not on file    Emotionally abused: Not on file    Physically abused: Not on file    Forced sexual activity: Not on file  Other Topics Concern  . Not on file  Social History Narrative  . Not on file    Outpatient Encounter Medications as of 11/28/2018  Medication Sig  . amphetamine-dextroamphetamine (ADDERALL) 20 MG tablet Take 20 mg by mouth daily.   . ARIPiprazole (ABILIFY) 5 MG tablet Take 5 mg by mouth daily.  . buprenorphine (SUBUTEX) 8 MG SUBL SL tablet Place 8 mg under the tongue 3 (three) times daily.   Marland Kitchen gabapentin (NEURONTIN) 600 MG tablet Take 600 mg by mouth 4 (four) times daily.  . hydrOXYzine (VISTARIL) 25 MG capsule Take 25 mg by mouth every 8 (eight) hours as needed.  . prazosin (MINIPRESS) 2 MG capsule Take 2 mg by mouth at bedtime.  . traZODone (DESYREL) 50 MG tablet Take 50 mg by mouth. 1-2 tablets at bedtime  . [DISCONTINUED] hydrOXYzine (VISTARIL) 50 MG capsule Take 50 mg by mouth 3 (three) times daily as needed.  . [DISCONTINUED] prazosin (MINIPRESS) 1 MG capsule Take 1 mg by mouth at bedtime.  . DULoxetine (CYMBALTA) 60 MG capsule Take 2 capsules (120 mg total) by mouth daily.   . [DISCONTINUED] ARIPiprazole (ABILIFY) 2 MG tablet Take 2 mg by mouth daily.  . [DISCONTINUED] DULoxetine (CYMBALTA) 60 MG capsule Take 1 capsule (60 mg total) by mouth daily for 30 days.  . [DISCONTINUED] naproxen (NAPROSYN) 500 MG tablet Take 1 tablet (500 mg total) by mouth 2 (two) times daily with a meal.  . [DISCONTINUED] ondansetron (ZOFRAN) 4 MG tablet Take 1 tablet (4 mg total) by mouth every 8 (eight) hours as needed for nausea or vomiting.   No facility-administered encounter medications on file as of 11/28/2018.     Allergies  Allergen Reactions  . Penicillins     Review of Systems  Constitutional: Positive for fatigue. Negative for activity change, appetite change, chills, diaphoresis, fever and unexpected weight change.  HENT: Negative.   Eyes: Negative.  Negative for photophobia and visual disturbance.  Respiratory: Negative for cough, choking, chest tightness, shortness of breath and wheezing.   Cardiovascular: Negative for chest pain, palpitations and leg swelling.  Gastrointestinal: Negative for abdominal distention, abdominal pain, anal bleeding, blood in stool, constipation, diarrhea, nausea, rectal pain and vomiting.  Endocrine: Negative.  Negative for cold intolerance, heat intolerance, polydipsia, polyphagia and polyuria.  Genitourinary: Negative for decreased urine volume, difficulty urinating, dysuria, frequency and urgency.  Musculoskeletal: Positive for arthralgias, back pain and myalgias. Negative for gait problem, joint swelling, neck pain and neck stiffness.  Skin: Negative.  Negative for color change and pallor.  Allergic/Immunologic: Negative.   Neurological: Positive for numbness. Negative for dizziness, tremors, seizures, syncope, facial asymmetry, speech difficulty, weakness, light-headedness and headaches.  Hematological: Negative.  Negative for adenopathy. Does not bruise/bleed easily.  Psychiatric/Behavioral: Negative for agitation, confusion,  hallucinations, sleep disturbance and suicidal ideas.  All other systems reviewed and are negative.       Objective:  BP 101/67   Pulse 63   Temp 99.1 F (37.3 C)   Ht 5' 3"  (1.6 m)   Wt 149 lb (67.6 kg)   BMI 26.39 kg/m    Wt Readings from Last 3 Encounters:  11/28/18 149 lb (67.6 kg)  06/26/18 143 lb (64.9 kg)  06/14/18 144 lb (65.3 kg)    Physical Exam Vitals signs and nursing note reviewed.  Constitutional:      General: She is not in acute distress.    Appearance: Normal appearance. She is well-developed, well-groomed and overweight. She is not ill-appearing, toxic-appearing or diaphoretic.     Comments: Pt appears fatigued during assessment, keeps closing eyes when provider is talking with her.   HENT:  Head: Normocephalic and atraumatic.     Jaw: There is normal jaw occlusion.     Right Ear: Hearing normal.     Left Ear: Hearing normal.     Nose: Nose normal.     Mouth/Throat:     Lips: Pink.     Mouth: Mucous membranes are moist.     Pharynx: Oropharynx is clear. Uvula midline.  Eyes:     General: Lids are normal.     Extraocular Movements: Extraocular movements intact.     Conjunctiva/sclera: Conjunctivae normal.     Pupils: Pupils are equal, round, and reactive to light.  Neck:     Musculoskeletal: Normal range of motion and neck supple.     Thyroid: No thyroid mass, thyromegaly or thyroid tenderness.     Vascular: No carotid bruit or JVD.     Trachea: Trachea and phonation normal.  Cardiovascular:     Rate and Rhythm: Normal rate and regular rhythm.     Chest Wall: PMI is not displaced.     Pulses: Normal pulses.     Heart sounds: Normal heart sounds. No murmur. No friction rub. No gallop.   Pulmonary:     Effort: Pulmonary effort is normal. No respiratory distress.     Breath sounds: Normal breath sounds. No wheezing.  Abdominal:     General: Bowel sounds are normal. There is no distension or abdominal bruit.     Palpations: Abdomen is soft.  There is no hepatomegaly or splenomegaly.     Tenderness: There is no abdominal tenderness. There is no right CVA tenderness or left CVA tenderness.     Hernia: No hernia is present.  Musculoskeletal: Normal range of motion.        General: No swelling, tenderness, deformity or signs of injury.     Right lower leg: No edema.     Left lower leg: No edema.  Lymphadenopathy:     Cervical: No cervical adenopathy.  Skin:    General: Skin is warm and dry.     Capillary Refill: Capillary refill takes less than 2 seconds.     Coloration: Skin is not cyanotic, jaundiced or pale.     Findings: No rash.  Neurological:     General: No focal deficit present.     Mental Status: She is alert and oriented to person, place, and time.     Cranial Nerves: Cranial nerves are intact. No cranial nerve deficit.     Sensory: Sensation is intact. No sensory deficit.     Motor: Motor function is intact. No weakness.     Coordination: Coordination is intact. Coordination normal.     Gait: Gait is intact. Gait normal.     Deep Tendon Reflexes: Reflexes are normal and symmetric. Reflexes normal.  Psychiatric:        Attention and Perception: Attention and perception normal.        Mood and Affect: Mood and affect normal.        Speech: Speech is delayed.        Behavior: Behavior normal. Behavior is cooperative.        Thought Content: Thought content normal.        Cognition and Memory: Cognition and memory normal.        Judgment: Judgment normal.     Results for orders placed or performed in visit on 06/26/18  CMP14+EGFR  Result Value Ref Range   Glucose 87 65 - 99 mg/dL   BUN 11 6 -  24 mg/dL   Creatinine, Ser 1.11 (H) 0.57 - 1.00 mg/dL   GFR calc non Af Amer 61 >59 mL/min/1.73   GFR calc Af Amer 71 >59 mL/min/1.73   BUN/Creatinine Ratio 10 9 - 23   Sodium 139 134 - 144 mmol/L   Potassium 4.2 3.5 - 5.2 mmol/L   Chloride 99 96 - 106 mmol/L   CO2 24 20 - 29 mmol/L   Calcium 9.4 8.7 - 10.2 mg/dL    Total Protein 6.5 6.0 - 8.5 g/dL   Albumin 4.0 3.8 - 4.8 g/dL   Globulin, Total 2.5 1.5 - 4.5 g/dL   Albumin/Globulin Ratio 1.6 1.2 - 2.2   Bilirubin Total 0.3 0.0 - 1.2 mg/dL   Alkaline Phosphatase 201 (H) 39 - 117 IU/L   AST 59 (H) 0 - 40 IU/L   ALT 73 (H) 0 - 32 IU/L  Gamma GT  Result Value Ref Range   GGT 68 (H) 0 - 60 IU/L       Pertinent labs & imaging results that were available during my care of the patient were reviewed by me and considered in my medical decision making.  Assessment & Plan:  Aleiah was seen today for medical management of chronic issues.  Diagnoses and all orders for this visit:  Chronic pain of multiple joints Neuropathy Paresthesia of both hands Ongoing pain with Cymbalta 60 mg daily. Pt requesting Lyrica. Pt aware she was on gabapentin, adderall, and subutex. Pt aware Lyrica can not be prescribed for concurrent use with the above medications. Due to ongoing and worsening symptoms, will increase Cymbalta and refer to neurology for further evaluation.  -     DULoxetine (CYMBALTA) 60 MG capsule; Take 2 capsules (120 mg total) by mouth daily. -     Ambulatory referral to Neurology  Elevated liver enzymes Will recheck labs today. Avoid excessive tylenol and alcohol.  -     CMP14+EGFR  Other fatigue Ongoing symptoms, will recheck labs today.  -     CMP14+EGFR -     Thyroid Panel With TSH -     CBC with Differential/Platelet     Continue all other maintenance medications.  Follow up plan: Return if symptoms worsen or fail to improve.  Continue healthy lifestyle choices, including diet (rich in fruits, vegetables, and lean proteins, and low in salt and simple carbohydrates) and exercise (at least 30 minutes of moderate physical activity daily).  Educational handout given for chronic pain   The above assessment and management plan was discussed with the patient. The patient verbalized understanding of and has agreed to the management plan. Patient  is aware to call the clinic if symptoms persist or worsen. Patient is aware when to return to the clinic for a follow-up visit. Patient educated on when it is appropriate to go to the emergency department.   Monia Pouch, FNP-C [facility] [phone] 11/28/18

## 2018-11-29 LAB — CMP14+EGFR
ALT: 16 IU/L (ref 0–32)
AST: 18 IU/L (ref 0–40)
Albumin/Globulin Ratio: 1.5 (ref 1.2–2.2)
Albumin: 3.8 g/dL (ref 3.8–4.8)
Alkaline Phosphatase: 109 IU/L (ref 39–117)
BUN/Creatinine Ratio: 13 (ref 9–23)
BUN: 12 mg/dL (ref 6–24)
Bilirubin Total: <0.2 mg/dL (ref 0.0–1.2)
CO2: 27 mmol/L (ref 20–29)
Calcium: 9.2 mg/dL (ref 8.7–10.2)
Chloride: 100 mmol/L (ref 96–106)
Creatinine, Ser: 0.93 mg/dL (ref 0.57–1.00)
GFR calc Af Amer: 87 mL/min/{1.73_m2} (ref >59)
GFR calc non Af Amer: 76 mL/min/{1.73_m2} (ref >59)
Globulin, Total: 2.6 g/dL (ref 1.5–4.5)
Glucose: 103 mg/dL — ABNORMAL HIGH (ref 65–99)
Potassium: 4.1 mmol/L (ref 3.5–5.2)
Sodium: 140 mmol/L (ref 134–144)
Total Protein: 6.4 g/dL (ref 6.0–8.5)

## 2018-11-29 LAB — CBC WITH DIFFERENTIAL/PLATELET
Basophils Absolute: 0 10*3/uL (ref 0.0–0.2)
Basos: 1 %
EOS (ABSOLUTE): 0.2 10*3/uL (ref 0.0–0.4)
Eos: 6 %
Hematocrit: 36.3 % (ref 34.0–46.6)
Hemoglobin: 12.3 g/dL (ref 11.1–15.9)
Immature Grans (Abs): 0 10*3/uL (ref 0.0–0.1)
Immature Granulocytes: 0 %
Lymphocytes Absolute: 1.5 10*3/uL (ref 0.7–3.1)
Lymphs: 40 %
MCH: 30.6 pg (ref 26.6–33.0)
MCHC: 33.9 g/dL (ref 31.5–35.7)
MCV: 90 fL (ref 79–97)
Monocytes Absolute: 0.3 10*3/uL (ref 0.1–0.9)
Monocytes: 9 %
Neutrophils Absolute: 1.6 10*3/uL (ref 1.4–7.0)
Neutrophils: 44 %
Platelets: 185 10*3/uL (ref 150–450)
RBC: 4.02 x10E6/uL (ref 3.77–5.28)
RDW: 13.2 % (ref 11.7–15.4)
WBC: 3.7 10*3/uL (ref 3.4–10.8)

## 2018-11-29 LAB — THYROID PANEL WITH TSH
Free Thyroxine Index: 1.9 (ref 1.2–4.9)
T3 Uptake Ratio: 27 % (ref 24–39)
T4, Total: 7 ug/dL (ref 4.5–12.0)
TSH: 2.72 u[IU]/mL (ref 0.450–4.500)

## 2018-12-10 ENCOUNTER — Other Ambulatory Visit: Payer: Self-pay | Admitting: Family Medicine

## 2018-12-10 DIAGNOSIS — K0889 Other specified disorders of teeth and supporting structures: Secondary | ICD-10-CM

## 2019-01-08 ENCOUNTER — Telehealth: Payer: Self-pay | Admitting: *Deleted

## 2019-01-08 ENCOUNTER — Encounter

## 2019-01-08 ENCOUNTER — Encounter: Payer: Self-pay | Admitting: Neurology

## 2019-01-08 ENCOUNTER — Ambulatory Visit: Payer: BLUE CROSS/BLUE SHIELD | Admitting: Neurology

## 2019-01-08 NOTE — Telephone Encounter (Signed)
No showed new patient appointment. 

## 2019-01-17 IMAGING — DX DG FOOT COMPLETE 3+V*L*
3 series · 3 of 3 positions shown · non-contrast
Comparison: None.

CLINICAL DATA: Pain, bruising, swelling, status post fall

EXAM:
LEFT FOOT - COMPLETE 3+ VIEW

[foot ap]
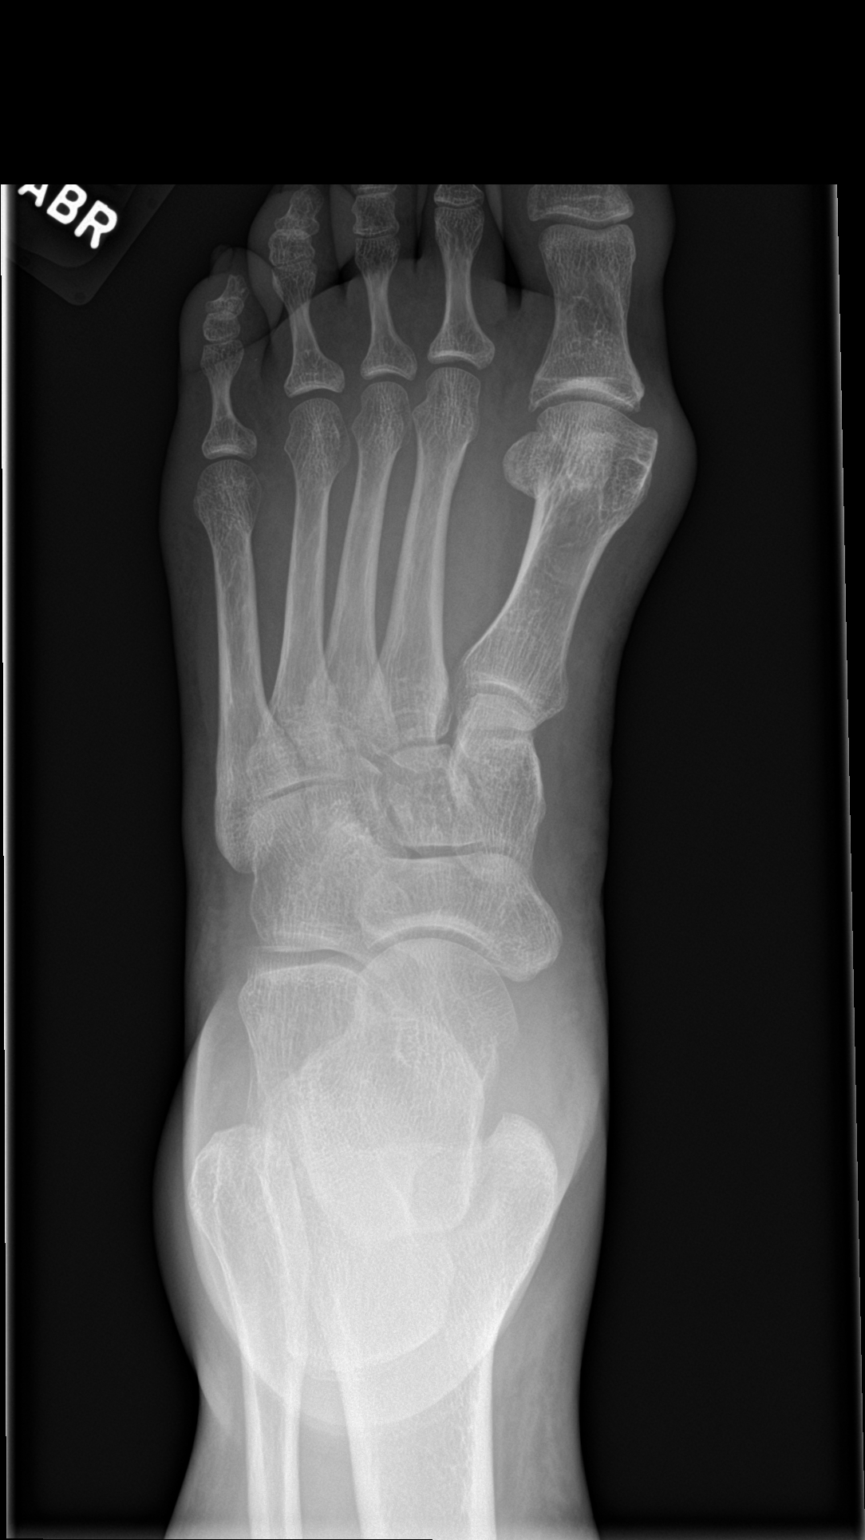

[foot obl]
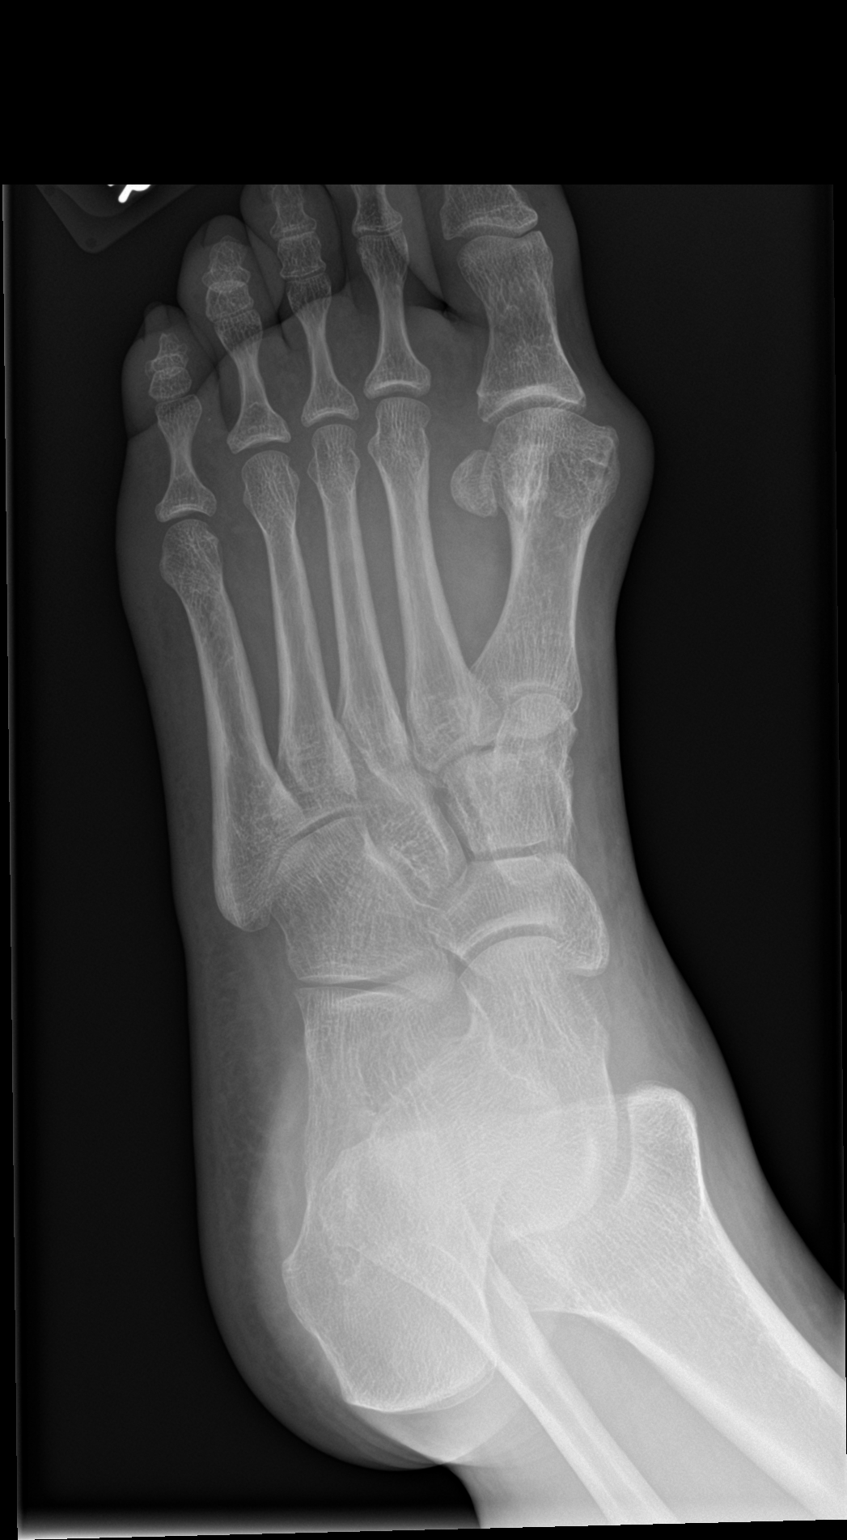

[foot lat]
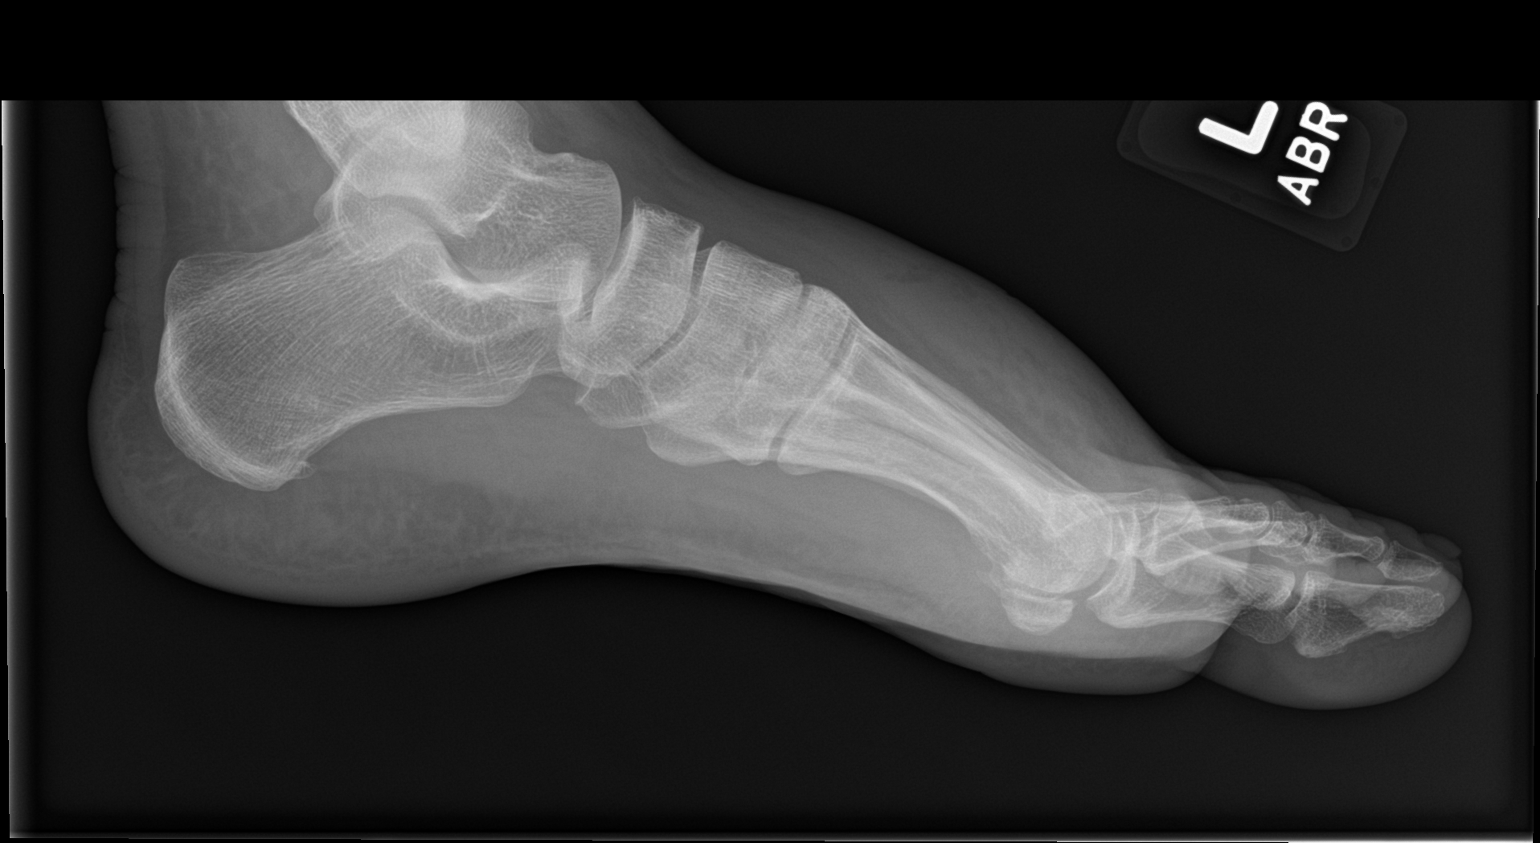

[3 of 3 positions shown; findings below may reference images not displayed]

FINDINGS: No fracture or dislocation is seen.

Mild degenerative changes the 1st MTP joint.

Moderate soft tissue swelling overlying the dorsal forefoot.
IMPRESSION: No fracture or dislocation is seen.

Moderate dorsal soft tissue swelling.

## 2019-05-07 ENCOUNTER — Other Ambulatory Visit: Payer: Self-pay | Admitting: Family Medicine

## 2019-05-07 DIAGNOSIS — R202 Paresthesia of skin: Secondary | ICD-10-CM

## 2019-05-07 DIAGNOSIS — G629 Polyneuropathy, unspecified: Secondary | ICD-10-CM

## 2019-05-07 DIAGNOSIS — G8929 Other chronic pain: Secondary | ICD-10-CM

## 2019-05-07 DIAGNOSIS — M2550 Pain in unspecified joint: Secondary | ICD-10-CM

## 2019-05-28 ENCOUNTER — Ambulatory Visit (INDEPENDENT_AMBULATORY_CARE_PROVIDER_SITE_OTHER): Payer: Self-pay | Admitting: Family Medicine

## 2019-05-28 ENCOUNTER — Encounter: Payer: Self-pay | Admitting: Family Medicine

## 2019-05-28 DIAGNOSIS — K529 Noninfective gastroenteritis and colitis, unspecified: Secondary | ICD-10-CM

## 2019-05-28 MED ORDER — ONDANSETRON 4 MG PO TBDP: 4.0000 mg | ORAL_TABLET | Freq: Three times a day (TID) | ORAL | 0 refills | Status: DC | PRN

## 2019-05-28 NOTE — Patient Instructions (Signed)
Food Choices to Help Relieve Diarrhea, Adult When you have diarrhea, the foods you eat and your eating habits are very important. Choosing the right foods and drinks can help:  Relieve diarrhea.  Replace lost fluids and nutrients.  Prevent dehydration. What general guidelines should I follow?  Relieving diarrhea  Choose foods with less than 2 g or .07 oz. of fiber per serving.  Limit fats to less than 8 tsp (38 g or 1.34 oz.) a day.  Avoid the following: ? Foods and beverages sweetened with high-fructose corn syrup, honey, or sugar alcohols such as xylitol, sorbitol, and mannitol. ? Foods that contain a lot of fat or sugar. ? Fried, greasy, or spicy foods. ? High-fiber grains, breads, and cereals. ? Raw fruits and vegetables.  Eat foods that are rich in probiotics. These foods include dairy products such as yogurt and fermented milk products. They help increase healthy bacteria in the stomach and intestines (gastrointestinal tract, or GI tract).  If you have lactose intolerance, avoid dairy products. These may make your diarrhea worse.  Take medicine to help stop diarrhea (antidiarrheal medicine) only as told by your health care provider. Replacing nutrients  Eat small meals or snacks every 3-4 hours.  Eat bland foods, such as white rice, toast, or baked potato, until your diarrhea starts to get better. Gradually reintroduce nutrient-rich foods as tolerated or as told by your health care provider. This includes: ? Well-cooked protein foods. ? Peeled, seeded, and soft-cooked fruits and vegetables. ? Low-fat dairy products.  Take vitamin and mineral supplements as told by your health care provider. Preventing dehydration  Start by sipping water or a special solution to prevent dehydration (oral rehydration solution, ORS). Urine that is clear or pale yellow means that you are getting enough fluid.  Try to drink at least 8-10 cups of fluid each day to help replace lost  fluids.  You may add other liquids in addition to water, such as clear juice or decaffeinated sports drinks, as tolerated or as told by your health care provider.  Avoid drinks with caffeine, such as coffee, tea, or soft drinks.  Avoid alcohol. What foods are recommended?     The items listed may not be a complete list. Talk with your health care provider about what dietary choices are best for you. Grains White rice. White, French, or pita breads (fresh or toasted), including plain rolls, buns, or bagels. White pasta. Saltine, soda, or graham crackers. Pretzels. Low-fiber cereal. Cooked cereals made with water (such as cornmeal, farina, or cream cereals). Plain muffins. Matzo. Melba toast. Zwieback. Vegetables Potatoes (without the skin). Most well-cooked and canned vegetables without skins or seeds. Tender lettuce. Fruits Apple sauce. Fruits canned in juice. Cooked apricots, cherries, grapefruit, peaches, pears, or plums. Fresh bananas and cantaloupe. Meats and other protein foods Baked or boiled chicken. Eggs. Tofu. Fish. Seafood. Smooth nut butters. Ground or well-cooked tender beef, ham, veal, lamb, pork, or poultry. Dairy Plain yogurt, kefir, and unsweetened liquid yogurt. Lactose-free milk, buttermilk, skim milk, or soy milk. Low-fat or nonfat hard cheese. Beverages Water. Low-calorie sports drinks. Fruit juices without pulp. Strained tomato and vegetable juices. Decaffeinated teas. Sugar-free beverages not sweetened with sugar alcohols. Oral rehydration solutions, if approved by your health care provider. Seasoning and other foods Bouillon, broth, or soups made from recommended foods. What foods are not recommended? The items listed may not be a complete list. Talk with your health care provider about what dietary choices are best for you. Grains Whole   grain, whole wheat, bran, or rye breads, rolls, pastas, and crackers. Wild or brown rice. Whole grain or bran cereals. Barley.  Oats and oatmeal. Corn tortillas or taco shells. Granola. Popcorn. Vegetables Raw vegetables. Fried vegetables. Cabbage, broccoli, Brussels sprouts, artichokes, baked beans, beet greens, corn, kale, legumes, peas, sweet potatoes, and yams. Potato skins. Cooked spinach and cabbage. Fruits Dried fruit, including raisins and dates. Raw fruits. Stewed or dried prunes. Canned fruits with syrup. Meat and other protein foods Fried or fatty meats. Deli meats. Chunky nut butters. Nuts and seeds. Beans and lentils. Bacon. Hot dogs. Sausage. Dairy High-fat cheeses. Whole milk, chocolate milk, and beverages made with milk, such as milk shakes. Half-and-half. Cream. sour cream. Ice cream. Beverages Caffeinated beverages (such as coffee, tea, soda, or energy drinks). Alcoholic beverages. Fruit juices with pulp. Prune juice. Soft drinks sweetened with high-fructose corn syrup or sugar alcohols. High-calorie sports drinks. Fats and oils Butter. Cream sauces. Margarine. Salad oils. Plain salad dressings. Olives. Avocados. Mayonnaise. Sweets and desserts Sweet rolls, doughnuts, and sweet breads. Sugar-free desserts sweetened with sugar alcohols such as xylitol and sorbitol. Seasoning and other foods Honey. Hot sauce. Chili powder. Gravy. Cream-based or milk-based soups. Pancakes and waffles. Summary  When you have diarrhea, the foods you eat and your eating habits are very important.  Make sure you get at least 8-10 cups of fluid each day, or enough to keep your urine clear or pale yellow.  Eat bland foods and gradually reintroduce healthy, nutrient-rich foods as tolerated, or as told by your health care provider.  Avoid high-fiber, fried, greasy, or spicy foods. This information is not intended to replace advice given to you by your health care provider. Make sure you discuss any questions you have with your health care provider. Document Revised: 07/26/2018 Document Reviewed: 04/01/2016 Elsevier Patient  Education  2020 [employer]  

## 2019-05-28 NOTE — Progress Notes (Signed)
Telephone visit  Subjective: CC: GI illness PCP: Sonny Masters, FNP Melanie Craig is a 44 y.o. female calls for telephone consult today. Patient provides verbal consent for consult held via phone.  Due to COVID-19 pandemic this visit was conducted virtually. This visit type was conducted due to national recommendations for restrictions regarding the COVID-19 Pandemic (e.g. social distancing, sheltering in place) in an effort to limit this patient's exposure and mitigate transmission in our community. All issues noted in this document were discussed and addressed.  A physical exam was not performed with this format.   Location of patient: home Location of provider: Working remotely from home Others present for call: none  1. Nausea, vomiting She reports onset nausea and vomiting yesterday evening.  She reports sweating.  She reports fatigue.  She reports non-bloody diarrhea. She denies consumption undercooked food, untreated water.  She ate out a few times before it started but food seemed fine.  No known sick contacts or other sick persons who consumed the meals.  She denies fevers.  Denies cough, congestion, rhinorrhea.  She had a COVID vaccine last month. She was supposed to get the second shot today.  No LMP recorded. Patient has had a hysterectomy.  ROS: Per HPI  Allergies  Allergen Reactions  . Penicillins    Past Medical History:  Diagnosis Date  . Depression   . PTSD (post-traumatic stress disorder)     Current Outpatient Medications:  .  amphetamine-dextroamphetamine (ADDERALL) 20 MG tablet, Take 20 mg by mouth daily. , Disp: , Rfl:  .  ARIPiprazole (ABILIFY) 5 MG tablet, Take 5 mg by mouth daily., Disp: , Rfl:  .  buprenorphine (SUBUTEX) 8 MG SUBL SL tablet, Place 8 mg under the tongue 3 (three) times daily. , Disp: , Rfl:  .  DULoxetine (CYMBALTA) 60 MG capsule, TAKE TWO CAPSULES BY MOUTH EVERY DAY, Disp: 60 capsule, Rfl: 3 .  gabapentin (NEURONTIN) 600 MG tablet,  Take 600 mg by mouth 4 (four) times daily., Disp: , Rfl:  .  hydrOXYzine (VISTARIL) 25 MG capsule, Take 25 mg by mouth every 8 (eight) hours as needed., Disp: , Rfl:  .  naproxen (NAPROSYN) 500 MG tablet, TAKE 1 TABLET BY MOUTH TWICE DAILY with a meal, Disp: 60 tablet, Rfl: 1 .  prazosin (MINIPRESS) 2 MG capsule, Take 2 mg by mouth at bedtime., Disp: , Rfl:  .  traZODone (DESYREL) 50 MG tablet, Take 50 mg by mouth. 1-2 tablets at bedtime, Disp: , Rfl:   Assessment/ Plan: 44 y.o. female   1. Gastroenteritis Likely viral.  I did reiterate that she should consider getting evaluated for COVID-19 as the GI symptoms she experiencing can present as the only symptoms in certain cases.  We discussed adequate hydration.  Zofran sent in for nausea and vomiting.  Discussed red flag signs and symptoms warranting further evaluation emergency department. - ondansetron (ZOFRAN ODT) 4 MG disintegrating tablet; Take 1 tablet (4 mg total) by mouth every 8 (eight) hours as needed for nausea or vomiting.  Dispense: 20 tablet; Refill: 0  Additionally, patient notes ongoing myalgia and arthralgia despite Cymbalta use.  She was asking for recommendations with regards to this and I will defer this to her PCP.  Start time: 4:08pm End time: 4:15pm  Total time spent on patient care (including telephone call/ virtual visit): 18 minutes  Melanie Craig Hulen Skains, DO [facility] [phone]

## 2019-06-12 ENCOUNTER — Encounter: Payer: Self-pay | Admitting: Family Medicine

## 2019-06-12 ENCOUNTER — Ambulatory Visit (INDEPENDENT_AMBULATORY_CARE_PROVIDER_SITE_OTHER): Payer: Self-pay | Admitting: Family Medicine

## 2019-06-12 DIAGNOSIS — R3989 Other symptoms and signs involving the genitourinary system: Secondary | ICD-10-CM

## 2019-06-12 MED ORDER — NITROFURANTOIN MONOHYD MACRO 100 MG PO CAPS: 100.0000 mg | ORAL_CAPSULE | Freq: Two times a day (BID) | ORAL | 0 refills | Status: AC

## 2019-06-12 MED ORDER — PHENAZOPYRIDINE HCL 100 MG PO TABS: 100.0000 mg | ORAL_TABLET | Freq: Three times a day (TID) | ORAL | 0 refills | Status: AC | PRN

## 2019-06-12 NOTE — Progress Notes (Signed)
Virtual Visit via Telephone Note  I connected with Melanie Craig on 06/12/19 at 1:57 PM by telephone and verified that I am speaking with the correct person using two identifiers. Melanie Craig is currently located at home and nobody is currently with her during this visit. The provider, Gwenlyn Fudge, FNP is located in their home at time of visit.  I discussed the limitations, risks, security and privacy concerns of performing an evaluation and management service by telephone and the availability of in person appointments. I also discussed with the patient that there may be a patient responsible charge related to this service. The patient expressed understanding and agreed to proceed.  Subjective: PCP: Sonny Masters, FNP  Chief Complaint  Patient presents with  . Urinary Tract Infection   Urinary Tract Infection: Patient complains of dysuria and suprapubic pressure She has had symptoms for 1 week. Patient also complains of back pain. Patient denies fever. Patient does have a history of recurrent UTI.     ROS: Per HPI  Current Outpatient Medications:  .  amphetamine-dextroamphetamine (ADDERALL) 20 MG tablet, Take 20 mg by mouth daily. , Disp: , Rfl:  .  ARIPiprazole (ABILIFY) 5 MG tablet, Take 5 mg by mouth daily., Disp: , Rfl:  .  buprenorphine (SUBUTEX) 8 MG SUBL SL tablet, Place 8 mg under the tongue 3 (three) times daily. , Disp: , Rfl:  .  DULoxetine (CYMBALTA) 60 MG capsule, TAKE TWO CAPSULES BY MOUTH EVERY DAY, Disp: 60 capsule, Rfl: 3 .  gabapentin (NEURONTIN) 600 MG tablet, Take 600 mg by mouth 4 (four) times daily., Disp: , Rfl:  .  hydrOXYzine (VISTARIL) 25 MG capsule, Take 25 mg by mouth every 8 (eight) hours as needed., Disp: , Rfl:  .  naproxen (NAPROSYN) 500 MG tablet, TAKE 1 TABLET BY MOUTH TWICE DAILY with a meal, Disp: 60 tablet, Rfl: 1 .  ondansetron (ZOFRAN ODT) 4 MG disintegrating tablet, Take 1 tablet (4 mg total) by mouth every 8 (eight) hours as needed for  nausea or vomiting., Disp: 20 tablet, Rfl: 0 .  prazosin (MINIPRESS) 2 MG capsule, Take 2 mg by mouth at bedtime., Disp: , Rfl:  .  traZODone (DESYREL) 50 MG tablet, Take 50 mg by mouth. 1-2 tablets at bedtime, Disp: , Rfl:   Allergies  Allergen Reactions  . Penicillins    Past Medical History:  Diagnosis Date  . Depression   . PTSD (post-traumatic stress disorder)     Observations/Objective: A&O  No respiratory distress or wheezing audible over the phone Mood, judgement, and thought processes all WNL   Assessment and Plan: 1. Suspected UTI - Education provided on UTI. - nitrofurantoin, macrocrystal-monohydrate, (MACROBID) 100 MG capsule; Take 1 capsule (100 mg total) by mouth 2 (two) times daily for 5 days. 1 po BId  Dispense: 10 capsule; Refill: 0 - phenazopyridine (PYRIDIUM) 100 MG tablet; Take 1 tablet (100 mg total) by mouth 3 (three) times daily as needed for up to 2 days for pain.  Dispense: 10 tablet; Refill: 0   Follow Up Instructions:  I discussed the assessment and treatment plan with the patient. The patient was provided an opportunity to ask questions and all were answered. The patient agreed with the plan and demonstrated an understanding of the instructions.   The patient was advised to call back or seek an in-person evaluation if the symptoms worsen or if the condition fails to improve as anticipated.  The above assessment and management plan  was discussed with the patient. The patient verbalized understanding of and has agreed to the management plan. Patient is aware to call the clinic if symptoms persist or worsen. Patient is aware when to return to the clinic for a follow-up visit. Patient educated on when it is appropriate to go to the emergency department.   Time call ended: 2:00 PM  I provided 5 minutes of non-face-to-face time during this encounter.  Hendricks Limes, MSN, APRN, FNP-C [facility] 06/12/19

## 2019-06-12 NOTE — Patient Instructions (Signed)
Urinary Tract Infection, Adult A urinary tract infection (UTI) is an infection of any part of the urinary tract. The urinary tract includes:  The kidneys.  The ureters.  The bladder.  The urethra. These organs make, store, and get rid of pee (urine) in the body. What are the causes? This is caused by germs (bacteria) in your genital area. These germs grow and cause swelling (inflammation) of your urinary tract. What increases the risk? You are more likely to develop this condition if:  You have a small, thin tube (catheter) to drain pee.  You cannot control when you pee or poop (incontinence).  You are female, and: ? You use these methods to prevent pregnancy:  A medicine that kills sperm (spermicide).  A device that blocks sperm (diaphragm). ? You have low levels of a female hormone (estrogen). ? You are pregnant.  You have genes that add to your risk.  You are sexually active.  You take antibiotic medicines.  You have trouble peeing because of: ? A prostate that is bigger than normal, if you are female. ? A blockage in the part of your body that drains pee from the bladder (urethra). ? A kidney stone. ? A nerve condition that affects your bladder (neurogenic bladder). ? Not getting enough to drink. ? Not peeing often enough.  You have other conditions, such as: ? Diabetes. ? A weak disease-fighting system (immune system). ? Sickle cell disease. ? Gout. ? Injury of the spine. What are the signs or symptoms? Symptoms of this condition include:  Needing to pee right away (urgently).  Peeing often.  Peeing small amounts often.  Pain or burning when peeing.  Blood in the pee.  Pee that smells bad or not like normal.  Trouble peeing.  Pee that is cloudy.  Fluid coming from the vagina, if you are female.  Pain in the belly or lower back. Other symptoms include:  Throwing up (vomiting).  No urge to eat.  Feeling mixed up (confused).  Being tired  and grouchy (irritable).  A fever.  Watery poop (diarrhea). How is this treated? This condition may be treated with:  Antibiotic medicine.  Other medicines.  Drinking enough water. Follow these instructions at home:  Medicines  Take over-the-counter and prescription medicines only as told by your doctor.  If you were prescribed an antibiotic medicine, take it as told by your doctor. Do not stop taking it even if you start to feel better. General instructions  Make sure you: ? Pee until your bladder is empty. ? Do not hold pee for a long time. ? Empty your bladder after sex. ? Wipe from front to back after pooping if you are a female. Use each tissue one time when you wipe.  Drink enough fluid to keep your pee pale yellow.  Keep all follow-up visits as told by your doctor. This is important. Contact a doctor if:  You do not get better after 1-2 days.  Your symptoms go away and then come back. Get help right away if:  You have very bad back pain.  You have very bad pain in your lower belly.  You have a fever.  You are sick to your stomach (nauseous).  You are throwing up. Summary  A urinary tract infection (UTI) is an infection of any part of the urinary tract.  This condition is caused by germs in your genital area.  There are many risk factors for a UTI. These include having a small, thin   tube to drain pee and not being able to control when you pee or poop.  Treatment includes antibiotic medicines for germs.  Drink enough fluid to keep your pee pale yellow. This information is not intended to replace advice given to you by your health care provider. Make sure you discuss any questions you have with your health care provider. Document Revised: 03/22/2018 Document Reviewed: 10/12/2017 Elsevier Patient Education  2020 Elsevier Inc[employer]  

## 2019-06-22 ENCOUNTER — Other Ambulatory Visit: Payer: Self-pay | Admitting: Family Medicine

## 2019-06-22 DIAGNOSIS — M62830 Muscle spasm of back: Secondary | ICD-10-CM

## 2019-06-22 MED ORDER — PREDNISONE 10 MG (21) PO TBPK: ORAL_TABLET | ORAL | 0 refills | Status: DC

## 2019-08-16 ENCOUNTER — Telehealth (INDEPENDENT_AMBULATORY_CARE_PROVIDER_SITE_OTHER): Payer: Self-pay | Admitting: Family

## 2019-08-16 ENCOUNTER — Encounter: Payer: Self-pay | Admitting: Family

## 2019-08-16 DIAGNOSIS — J441 Chronic obstructive pulmonary disease with (acute) exacerbation: Secondary | ICD-10-CM

## 2019-08-16 MED ORDER — ALBUTEROL SULFATE HFA 108 (90 BASE) MCG/ACT IN AERS: 2.0000 | INHALATION_SPRAY | Freq: Four times a day (QID) | RESPIRATORY_TRACT | 0 refills | Status: AC | PRN

## 2019-08-16 MED ORDER — PREDNISONE 10 MG (21) PO TBPK: ORAL_TABLET | ORAL | 0 refills | Status: DC

## 2019-08-16 MED ORDER — AZITHROMYCIN 250 MG PO TABS: ORAL_TABLET | ORAL | 0 refills | Status: DC

## 2019-08-16 NOTE — Progress Notes (Signed)
Virtual Visit via telephone Note Due to COVID-19 pandemic this visit was conducted virtually. This visit type was conducted due to national recommendations for restrictions regarding the COVID-19 Pandemic (e.g. social distancing, sheltering in place) in an effort to limit this patient's exposure and mitigate transmission in our community. All issues noted in this document were discussed and addressed.  A physical exam was not performed with this format.  I connected with Melanie Craig on 08/16/19 at 3:30 pm by telephone and verified that I am speaking with the correct person using two identifiers. Melanie Craig is currently located at home and child is currently with her during visit. The provider, Jannifer Rodney, FNP is located in their office at time of visit.  I discussed the limitations, risks, security and privacy concerns of performing an evaluation and management service by telephone and the availability of in person appointments. I also discussed with the patient that there may be a patient responsible charge related to this service. The patient expressed understanding and agreed to proceed.   History and Present Illness:  Cough This is a new problem. The current episode started yesterday. The problem has been gradually worsening. The problem occurs every few minutes. The cough is non-productive. Associated symptoms include chills, ear pain (left), a fever, headaches, nasal congestion, shortness of breath and wheezing. Pertinent negatives include no myalgias, postnasal drip or sore throat. Risk factors for lung disease include smoking/tobacco exposure. She has tried rest and ipratropium inhaler for the symptoms.      Review of Systems  Constitutional: Positive for chills and fever.  HENT: Positive for ear pain (left). Negative for postnasal drip and sore throat.   Respiratory: Positive for cough, shortness of breath and wheezing.   Musculoskeletal: Negative for myalgias.   Neurological: Positive for headaches.  All other systems reviewed and are negative.    Observations/Objective: Intermittent coarse cough, wheezing noted  Assessment and Plan: Melanie Craig comes in today with chief complaint of No chief complaint on file.   Diagnosis and orders addressed:  1. COPD exacerbation (HCC) - Take meds as prescribed - Use a cool mist humidifier  -Use saline nose sprays frequently -Force fluids -For any cough or congestion  Use plain Mucinex- regular strength or max strength is fine -For fever or aces or pains- take tylenol or ibuprofen. -Throat lozenges if help -RTO if symptoms worsen or do not improve  - albuterol (VENTOLIN HFA) 108 (90 Base) MCG/ACT inhaler; Inhale 2 puffs into the lungs every 6 (six) hours as needed for wheezing or shortness of breath.  Dispense: 8 g; Refill: 0 - azithromycin (ZITHROMAX) 250 MG tablet; Take 500 mg once, then 250 mg for four days  Dispense: 6 tablet; Refill: 0 - predniSONE (STERAPRED UNI-PAK 21 TAB) 10 MG (21) TBPK tablet; Use as directed  Dispense: 21 tablet; Refill: 0     I discussed the assessment and treatment plan with the patient. The patient was provided an opportunity to ask questions and all were answered. The patient agreed with the plan and demonstrated an understanding of the instructions.   The patient was advised to call back or seek an in-person evaluation if the symptoms worsen or if the condition fails to improve as anticipated.  The above assessment and management plan was discussed with the patient. The patient verbalized understanding of and has agreed to the management plan. Patient is aware to call the clinic if symptoms persist or worsen. Patient is aware when to return to  the clinic for a follow-up visit. Patient educated on when it is appropriate to go to the emergency department.   Time call ended: 3:40 pm    I provided 10  minutes of non-face-to-face time during this encounter.     Evelina Dun, FNP

## 2019-09-17 ENCOUNTER — Other Ambulatory Visit: Payer: Self-pay

## 2019-09-17 DIAGNOSIS — G8929 Other chronic pain: Secondary | ICD-10-CM

## 2019-09-17 DIAGNOSIS — G629 Polyneuropathy, unspecified: Secondary | ICD-10-CM

## 2019-09-17 DIAGNOSIS — R202 Paresthesia of skin: Secondary | ICD-10-CM

## 2019-09-17 MED ORDER — DULOXETINE HCL 60 MG PO CPEP: 120.0000 mg | ORAL_CAPSULE | Freq: Every day | ORAL | 0 refills | Status: DC

## 2019-10-30 ENCOUNTER — Other Ambulatory Visit: Payer: Self-pay | Admitting: Family Medicine

## 2019-10-30 DIAGNOSIS — G8929 Other chronic pain: Secondary | ICD-10-CM

## 2019-10-30 DIAGNOSIS — G629 Polyneuropathy, unspecified: Secondary | ICD-10-CM

## 2019-10-30 DIAGNOSIS — R202 Paresthesia of skin: Secondary | ICD-10-CM

## 2019-11-01 ENCOUNTER — Telehealth: Payer: Self-pay | Admitting: Family Medicine

## 2019-11-01 ENCOUNTER — Telehealth: Payer: Self-pay | Admitting: *Deleted

## 2019-11-01 ENCOUNTER — Other Ambulatory Visit: Payer: Self-pay

## 2019-11-01 ENCOUNTER — Encounter: Payer: Self-pay | Admitting: Family Medicine

## 2019-11-01 ENCOUNTER — Ambulatory Visit: Payer: BLUE CROSS/BLUE SHIELD | Admitting: Family Medicine

## 2019-11-01 VITALS — BP 129/82 | HR 84 | Temp 98.3°F | Ht 63.0 in | Wt 152.4 lb

## 2019-11-01 DIAGNOSIS — R232 Flushing: Secondary | ICD-10-CM | POA: Diagnosis not present

## 2019-11-01 DIAGNOSIS — K219 Gastro-esophageal reflux disease without esophagitis: Secondary | ICD-10-CM | POA: Diagnosis not present

## 2019-11-01 DIAGNOSIS — M2550 Pain in unspecified joint: Secondary | ICD-10-CM | POA: Diagnosis not present

## 2019-11-01 DIAGNOSIS — Z720 Tobacco use: Secondary | ICD-10-CM | POA: Diagnosis not present

## 2019-11-01 DIAGNOSIS — G8929 Other chronic pain: Secondary | ICD-10-CM

## 2019-11-01 MED ORDER — DICLOFENAC SODIUM 75 MG PO TBEC: 75.0000 mg | DELAYED_RELEASE_TABLET | Freq: Two times a day (BID) | ORAL | 2 refills | Status: DC

## 2019-11-01 MED ORDER — FLUOXETINE HCL 20 MG PO CAPS: 20.0000 mg | ORAL_CAPSULE | Freq: Every day | ORAL | 1 refills | Status: AC

## 2019-11-01 MED ORDER — DULOXETINE HCL 30 MG PO CPEP: ORAL_CAPSULE | ORAL | 0 refills | Status: DC

## 2019-11-01 MED ORDER — ESOMEPRAZOLE MAGNESIUM 40 MG PO CPDR: 40.0000 mg | DELAYED_RELEASE_CAPSULE | Freq: Every day | ORAL | 3 refills | Status: DC

## 2019-11-01 MED ORDER — FLUOXETINE HCL 20 MG PO TABS: 20.0000 mg | ORAL_TABLET | Freq: Every day | ORAL | 2 refills | Status: DC

## 2019-11-01 NOTE — Telephone Encounter (Signed)
  Prescription Request  11/01/2019  What is the name of the medication or equipment? Dulozetine  Have you contacted your pharmacy to request a refill? (if applicable) Yes  Which pharmacy would you like this sent to? [facility]  Pt states that she called pharmacy to get a refill and was told that she needed to contact PCP office. Told pt she needs to be seen because she is overdue for appt. Pt says she goes out of town tomorrow and wont be back until 11/09/19. Wants to know if someone can send in enough of the medicine to the pharmacy to last her until she comes in for her appt on 7/26 with Britney.   Patient notified that their request is being sent to the clinical staff for review and that they should receive a response within 2 business days.

## 2019-11-01 NOTE — Patient Instructions (Addendum)
Estroven over the counter for hot flashes.   Decrease Cymbalta to 60 mg once daily x2 weeks, then decrease to 30 mg x2 weeks, then switch to Prozac 20 mg once daily.   Start Prozac after you are weaned off Cymbalta.

## 2019-11-01 NOTE — Telephone Encounter (Signed)
Pt insurance denied TABs and prefers CAP - med to be switched (verbal ok from provider)

## 2019-11-01 NOTE — Telephone Encounter (Signed)
Pt has appt today with Deliah Boston for refills.

## 2019-11-01 NOTE — Progress Notes (Signed)
Assessment & Plan:  1. Hot flashes - Encouraged to try OTC Estroven.  Rx Prozac 20 mg once daily to help with hot flashes and her mood.  She was instructed on how to wean off Cymbalta given that she was on such a high dose of 120 mg daily, but she does not feel she needs to wean off since she has already been off of it x5 days and is not have any withdrawal symptoms. - Thyroid Panel With TSH  2. Chronic pain of multiple joints - Lab work, NSAID for pain relief, and referral to rheumatology. - C-reactive protein - ANA - Anti-CCP Ab, IgG + IgA (RDL) - CBC with Differential/Platelet - CMP14+EGFR - Thyroid Panel With TSH - diclofenac (VOLTAREN) 75 MG EC tablet; Take 1 tablet (75 mg total) by mouth 2 (two) times daily.  Dispense: 60 tablet; Refill: 2  3. Gastroesophageal reflux disease, unspecified whether esophagitis present - esomeprazole (NEXIUM) 40 MG capsule; Take 1 capsule (40 mg total) by mouth daily.  Dispense: 30 capsule; Refill: 3  4. Tobacco use - Lipid panel   Return as directed after labs result.  Hendricks Limes, MSN, APRN, FNP-C [facility]  Subjective:    Patient ID: Melanie Craig, female    DOB: Mar 15, 1976, 44 y.o.   MRN: [id number]  Patient Care Team: Loman Brooklyn, FNP as PCP - General (Family Medicine)   Chief Complaint:  Chief Complaint  Patient presents with   [place] pt    Joint Pain    Patient states that it has been going on for 2 years and the Cymbalta does not help.   mood    x 4 months    HPI: Melanie Craig is a 44 y.o. female presenting on 11/01/2019 for Establish Care ([place] pt ), Joint Pain (Patient states that it has been going on for 2 years and the Cymbalta does not help.), and mood (x 4 months)  Patient has concerns about hot flashes and her mood.  She does have a psychiatrist and has mentioned it to them, but no changes were made to her medications.  She reports she quit taking trazodone and  Abilify as they were so sedating that when she took them she would not even wake up to go to the restroom and would urinate on herself.  Patient also reports her joints hurt all the time.  She was started on Cymbalta more than a year ago to help with this but states that she has never been able to tell a difference.  She has been off of the medication for 5 days now.  Denies any withdrawal symptoms.  States her knuckles just keep getting bigger and her fingers and toes are starting to curve.  States her feet and toes hurt so bad it is hard to drive sometimes because of the pain from pushing the gas and the break.  Patient is also requesting an medication be sent over for acid reflux so that she does not have to purchase over-the-counter.  Social history:  Relevant past medical, surgical, family and social history reviewed and updated as indicated. Interim medical history since our last visit reviewed.  Allergies and medications reviewed and updated.  DATA REVIEWED: CHART IN EPIC  ROS: Negative unless specifically indicated above in HPI.    Current Outpatient Medications:    albuterol (VENTOLIN HFA) 108 (90 Base) MCG/ACT inhaler, Inhale 2 puffs into the lungs every 6 (six) hours as  needed for wheezing or shortness of breath., Disp: 8 g, Rfl: 0   amphetamine-dextroamphetamine (ADDERALL) 20 MG tablet, Take 20 mg by mouth daily. , Disp: , Rfl:    buprenorphine (SUBUTEX) 8 MG SUBL SL tablet, Place 8 mg under the tongue 3 (three) times daily. , Disp: , Rfl:    DULoxetine (CYMBALTA) 60 MG capsule, Take 2 capsules (120 mg total) by mouth daily. Needs to be seen for further refills, Disp: 60 capsule, Rfl: 0   gabapentin (NEURONTIN) 600 MG tablet, Take 600 mg by mouth 4 (four) times daily., Disp: , Rfl:    prazosin (MINIPRESS) 2 MG capsule, Take 2 mg by mouth at bedtime., Disp: , Rfl:    Allergies  Allergen Reactions   Penicillins    Past Medical History:  Diagnosis Date   Depression      PTSD (post-traumatic stress disorder)     Past Surgical History:  Procedure Laterality Date   ABDOMINAL HYSTERECTOMY     complete   CARPAL TUNNEL RELEASE      Social History   Socioeconomic History   Marital status: Divorced    Spouse name: Not on file   Number of children: Not on file   Years of education: Not on file   Highest education level: Not on file  Occupational History   Not on file  Tobacco Use   Smoking status: Current Every Day Smoker    Packs/day: 0.50    Years: 20.00    Pack years: 10.00    Types: Cigarettes   Smokeless tobacco: Never Used  Vaping Use   Vaping Use: Never used  Substance and Sexual Activity   Alcohol use: No   Drug use: No   Sexual activity: Not on file  Other Topics Concern   Not on file  Social History Narrative   Not on file   Social Determinants of Health   Financial Resource Strain:    Difficulty of Paying Living Expenses:   Food Insecurity:    Worried About Charity fundraiser in the Last Year:    Arboriculturist in the Last Year:   Transportation Needs:    Film/video editor (Medical):    Lack of Transportation (Non-Medical):   Physical Activity:    Days of Exercise per Week:    Minutes of Exercise per Session:   Stress:    Feeling of Stress :   Social Connections:    Frequency of Communication with Friends and Family:    Frequency of Social Gatherings with Friends and Family:    Attends Religious Services:    Active Member of Clubs or Organizations:    Attends Archivist Meetings:    Marital Status:   Intimate Partner Violence:    Fear of Current or Ex-Partner:    Emotionally Abused:    Physically Abused:    Sexually Abused:         Objective:    BP 129/82    Pulse 84    Temp 98.3 F (36.8 C) (Temporal)    Ht _0  (1.6 m)    Wt 152 lb 6.4 oz (69.1 kg)    SpO2 95%    BMI 27.00 kg/m   Wt Readings from Last 3 Encounters:  11/01/19 152 lb 6.4 oz (69.1 kg)   11/28/18 149 lb (67.6 kg)  06/26/18 143 lb (64.9 kg)    Physical Exam Vitals reviewed.  Constitutional:      General: She is not in acute  distress.    Appearance: Normal appearance. She is overweight. She is not ill-appearing, toxic-appearing or diaphoretic.  HENT:     Head: Normocephalic and atraumatic.  Eyes:     General: No scleral icterus.       Right eye: No discharge.        Left eye: No discharge.     Conjunctiva/sclera: Conjunctivae normal.  Cardiovascular:     Rate and Rhythm: Normal rate and regular rhythm.     Heart sounds: Normal heart sounds. No murmur heard.  No friction rub. No gallop.   Pulmonary:     Effort: Pulmonary effort is normal. No respiratory distress.     Breath sounds: Normal breath sounds. No stridor. No wheezing, rhonchi or rales.  Musculoskeletal:        General: Normal range of motion.     Cervical back: Normal range of motion.  Skin:    General: Skin is warm and dry.     Capillary Refill: Capillary refill takes less than 2 seconds.  Neurological:     General: No focal deficit present.     Mental Status: She is alert and oriented to person, place, and time. Mental status is at baseline.  Psychiatric:        Mood and Affect: Mood normal.        Behavior: Behavior normal.        Thought Content: Thought content normal.        Judgment: Judgment normal.     Lab Results  Component Value Date   TSH 1.800 11/01/2019   Lab Results  Component Value Date   WBC 5.9 11/01/2019   HGB 14.0 11/01/2019   HCT 40.5 11/01/2019   MCV 91 11/01/2019   PLT 204 11/01/2019   Lab Results  Component Value Date   NA 140 11/01/2019   K 3.9 11/01/2019   CO2 24 11/01/2019   GLUCOSE 104 (H) 11/01/2019   BUN 9 11/01/2019   CREATININE 0.86 11/01/2019   BILITOT <0.2 11/01/2019   ALKPHOS 122 (H) 11/01/2019   AST 23 11/01/2019   ALT 21 11/01/2019   PROT 7.4 11/01/2019   ALBUMIN 4.8 11/01/2019   CALCIUM 9.7 11/01/2019   Lab Results  Component Value  Date   CHOL 218 (H) 11/01/2019   Lab Results  Component Value Date   HDL 38 (L) 11/01/2019   Lab Results  Component Value Date   LDLCALC 138 (H) 11/01/2019   Lab Results  Component Value Date   TRIG 231 (H) 11/01/2019   Lab Results  Component Value Date   CHOLHDL 5.7 (H) 11/01/2019   No results found for: HGBA1C

## 2019-11-02 LAB — CMP14+EGFR
ALT: 21 IU/L (ref 0–32)
AST: 23 IU/L (ref 0–40)
Albumin/Globulin Ratio: 1.8 (ref 1.2–2.2)
Albumin: 4.8 g/dL (ref 3.8–4.8)
Alkaline Phosphatase: 122 IU/L — ABNORMAL HIGH (ref 48–121)
BUN/Creatinine Ratio: 10 (ref 9–23)
BUN: 9 mg/dL (ref 6–24)
Bilirubin Total: <0.2 mg/dL (ref 0.0–1.2)
CO2: 24 mmol/L (ref 20–29)
Calcium: 9.7 mg/dL (ref 8.7–10.2)
Chloride: 100 mmol/L (ref 96–106)
Creatinine, Ser: 0.86 mg/dL (ref 0.57–1.00)
GFR calc Af Amer: 95 mL/min/{1.73_m2} (ref >59)
GFR calc non Af Amer: 82 mL/min/{1.73_m2} (ref >59)
Globulin, Total: 2.6 g/dL (ref 1.5–4.5)
Glucose: 104 mg/dL — ABNORMAL HIGH (ref 65–99)
Potassium: 3.9 mmol/L (ref 3.5–5.2)
Sodium: 140 mmol/L (ref 134–144)
Total Protein: 7.4 g/dL (ref 6.0–8.5)

## 2019-11-02 LAB — THYROID PANEL WITH TSH
Free Thyroxine Index: 1.9 (ref 1.2–4.9)
T3 Uptake Ratio: 23 % — ABNORMAL LOW (ref 24–39)
T4, Total: 8.3 ug/dL (ref 4.5–12.0)
TSH: 1.8 u[IU]/mL (ref 0.450–4.500)

## 2019-11-02 LAB — CBC WITH DIFFERENTIAL/PLATELET
Basophils Absolute: 0 10*3/uL (ref 0.0–0.2)
Basos: 1 %
EOS (ABSOLUTE): 0.1 10*3/uL (ref 0.0–0.4)
Eos: 2 %
Hematocrit: 40.5 % (ref 34.0–46.6)
Hemoglobin: 14 g/dL (ref 11.1–15.9)
Immature Grans (Abs): 0 10*3/uL (ref 0.0–0.1)
Immature Granulocytes: 0 %
Lymphocytes Absolute: 2.2 10*3/uL (ref 0.7–3.1)
Lymphs: 38 %
MCH: 31.3 pg (ref 26.6–33.0)
MCHC: 34.6 g/dL (ref 31.5–35.7)
MCV: 91 fL (ref 79–97)
Monocytes Absolute: 0.5 10*3/uL (ref 0.1–0.9)
Monocytes: 8 %
Neutrophils Absolute: 3.1 10*3/uL (ref 1.4–7.0)
Neutrophils: 51 %
Platelets: 204 10*3/uL (ref 150–450)
RBC: 4.47 x10E6/uL (ref 3.77–5.28)
RDW: 12.8 % (ref 11.7–15.4)
WBC: 5.9 10*3/uL (ref 3.4–10.8)

## 2019-11-02 LAB — LIPID PANEL
Chol/HDL Ratio: 5.7 ratio — ABNORMAL HIGH (ref 0.0–4.4)
Cholesterol, Total: 218 mg/dL — ABNORMAL HIGH (ref 100–199)
HDL: 38 mg/dL — ABNORMAL LOW (ref >39)
LDL Chol Calc (NIH): 138 mg/dL — ABNORMAL HIGH (ref 0–99)
Triglycerides: 231 mg/dL — ABNORMAL HIGH (ref 0–149)
VLDL Cholesterol Cal: 42 mg/dL — ABNORMAL HIGH (ref 5–40)

## 2019-11-02 LAB — C-REACTIVE PROTEIN: CRP: 19 mg/L — ABNORMAL HIGH (ref 0–10)

## 2019-11-02 LAB — ANA: Anti Nuclear Antibody (ANA): NEGATIVE

## 2019-11-05 ENCOUNTER — Encounter: Payer: Self-pay | Admitting: Family Medicine

## 2019-11-05 DIAGNOSIS — E782 Mixed hyperlipidemia: Secondary | ICD-10-CM | POA: Insufficient documentation

## 2019-11-05 HISTORY — DX: Mixed hyperlipidemia: E78.2

## 2019-11-09 LAB — ANTI-CCP AB, IGG + IGA (RDL): Anti-CCP Ab, IgG + IgA (RDL): <20 Units (ref <20)

## 2019-11-11 ENCOUNTER — Ambulatory Visit: Payer: Self-pay | Admitting: Family Medicine

## 2019-11-15 ENCOUNTER — Telehealth: Payer: Self-pay | Admitting: Family Medicine

## 2019-11-15 MED ORDER — ONDANSETRON HCL 4 MG PO TABS: 4.0000 mg | ORAL_TABLET | Freq: Three times a day (TID) | ORAL | 0 refills | Status: AC | PRN

## 2019-11-15 NOTE — Telephone Encounter (Signed)
Patient states that the Voltaren is causing patient to have N & V and dizziness.  It has also gave her a migraine x 4 days.  Last took on Wednesday but she still had her migraine.  Patient would like something else sent in and also something sent in for nausea.  Patient uses [facility].  Please advise

## 2019-11-15 NOTE — Telephone Encounter (Signed)
Patient notified that Zofran sent to pharmacy. Patient aware that Brayton El is out of office but advised that I would forward her message to her for review when she returns to see if another medication can be called in. Patient verbalized understanding.

## 2019-11-15 NOTE — Telephone Encounter (Signed)
Zofran Prescription sent to pharmacy, she can take tylenol OTC for pain relief. As that diclofenac is a NSAID and can cause stomach irration. If she needs something stronger than that will need to get from PCP.

## 2019-11-16 NOTE — Telephone Encounter (Signed)
I certainly agree that patient can use Tylenol for pain relief.  She is more than welcome to try another NSAID given the anti-inflammatory effects of them.  Voltaren gel is a good option to get the NSAID without her ingesting it and causing upset stomach; she can buy this over-the-counter.

## 2019-11-18 NOTE — Telephone Encounter (Signed)
Patient aware and verbalizes understanding- states she has used the Voltaren before and it does not help.

## 2020-02-13 DIAGNOSIS — Z6827 Body mass index (BMI) 27.0-27.9, adult: Secondary | ICD-10-CM | POA: Diagnosis not present

## 2020-02-13 DIAGNOSIS — Z23 Encounter for immunization: Secondary | ICD-10-CM | POA: Diagnosis not present

## 2020-02-13 DIAGNOSIS — F17200 Nicotine dependence, unspecified, uncomplicated: Secondary | ICD-10-CM | POA: Diagnosis not present

## 2020-02-13 DIAGNOSIS — J45909 Unspecified asthma, uncomplicated: Secondary | ICD-10-CM | POA: Diagnosis not present

## 2020-02-13 DIAGNOSIS — F32A Depression, unspecified: Secondary | ICD-10-CM | POA: Diagnosis not present

## 2020-02-13 DIAGNOSIS — G8929 Other chronic pain: Secondary | ICD-10-CM | POA: Diagnosis not present

## 2020-03-11 DIAGNOSIS — Z23 Encounter for immunization: Secondary | ICD-10-CM | POA: Diagnosis not present

## 2020-03-23 NOTE — Progress Notes (Deleted)
Office Visit Note  Patient: Melanie Craig             Date of Birth: November 08, 1975           MRN: [id number]             PCP: Gwenlyn Fudge, FNP Referring: Gwenlyn Fudge, FNP Visit Date: 04/02/2020 Occupation: @GUAROCC @  Subjective:  No chief complaint on file.   History of Present Illness: Melanie L Wadsworth is a 44 y.o. female ***   Activities of Daily Living:  Patient reports morning stiffness for *** {minute/hour:19697}.   Patient {ACTIONS;DENIES/REPORTS:21021675::"Denies"} nocturnal pain.  Difficulty dressing/grooming: {ACTIONS;DENIES/REPORTS:21021675::"Denies"} Difficulty climbing stairs: {ACTIONS;DENIES/REPORTS:21021675::"Denies"} Difficulty getting out of chair: {ACTIONS;DENIES/REPORTS:21021675::"Denies"} Difficulty using hands for taps, buttons, cutlery, and/or writing: {ACTIONS;DENIES/REPORTS:21021675::"Denies"}  No Rheumatology ROS completed.   PMFS History:  Patient Active Problem List   Diagnosis Date Noted  . Mixed hyperlipidemia 11/05/2019  . Neuropathy 11/28/2018  . Paresthesia of both hands 11/28/2018  . Elevated liver enzymes 06/26/2018  . Chronic pain of multiple joints 06/14/2018  . Tummy Tuck 06/12/2018  . History of bilateral tubal ligation 06/12/2018  . H/O: hysterectomy - uterine cancer 06/12/2018  . History of augmentation mammoplasty 06/12/2018  . Migraine headache 06/12/2018  . History of breast mammoplasty 06/12/2018    Past Medical History:  Diagnosis Date  . Depression   . Mixed hyperlipidemia 11/05/2019  . PTSD (post-traumatic stress disorder)     Family History  Problem Relation Age of Onset  . Hypothyroidism Mother   . Dementia Mother   . Hypercholesterolemia Mother   . Cancer Father        penile  . Bipolar disorder Sister   . Cancer Maternal Grandfather        prostate & bone  . Bipolar disorder Sister   . Post-traumatic stress disorder Brother    Past Surgical History:  Procedure Laterality Date  . ABDOMINAL  HYSTERECTOMY     complete  . CARPAL TUNNEL RELEASE     Social History   Social History Narrative  . Not on file   Immunization History  Administered Date(s) Administered  . Tdap 07/26/2016     Objective: Vital Signs: There were no vitals taken for this visit.   Physical Exam   Musculoskeletal Exam: ***  CDAI Exam: CDAI Score: -- Patient Global: --; Provider Global: -- Swollen: --; Tender: -- Joint Exam 04/02/2020   No joint exam has been documented for this visit   There is currently no information documented on the homunculus. Go to the Rheumatology activity and complete the homunculus joint exam.  Investigation: No additional findings.  Imaging: No results found.  Recent Labs: Lab Results  Component Value Date   WBC 5.9 11/01/2019   HGB 14.0 11/01/2019   PLT 204 11/01/2019   NA 140 11/01/2019   K 3.9 11/01/2019   CL 100 11/01/2019   CO2 24 11/01/2019   GLUCOSE 104 (H) 11/01/2019   BUN 9 11/01/2019   CREATININE 0.86 11/01/2019   BILITOT <0.2 11/01/2019   ALKPHOS 122 (H) 11/01/2019   AST 23 11/01/2019   ALT 21 11/01/2019   PROT 7.4 11/01/2019   ALBUMIN 4.8 11/01/2019   CALCIUM 9.7 11/01/2019   GFRAA 95 11/01/2019    Speciality Comments: No specialty comments available.  Procedures:  No procedures performed Allergies: Penicillins   Assessment / Plan:     Visit Diagnoses: Chronic pain of multiple joints - 11/01/19: ANA negative, Anti-CCP<20, CRP 19  Paresthesia of both  hands  Neuropathy  H/O: hysterectomy - uterine cancer  History of augmentation mammoplasty  Mixed hyperlipidemia  Elevated liver enzymes  Hx of migraines  Orders: No orders of the defined types were placed in this encounter.  No orders of the defined types were placed in this encounter.   Face-to-face time spent with patient was *** minutes. Greater than 50% of time was spent in counseling and coordination of care.  Follow-Up Instructions: No follow-ups on  file.   Gearldine Bienenstock, PA-C  Note - This record has been created using Dragon software.  Chart creation errors have been sought, but may not always  have been located. Such creation errors do not reflect on  the standard of medical care.

## 2020-04-02 ENCOUNTER — Ambulatory Visit: Payer: BLUE CROSS/BLUE SHIELD | Admitting: Rheumatology

## 2020-04-02 DIAGNOSIS — Z90710 Acquired absence of both cervix and uterus: Secondary | ICD-10-CM

## 2020-04-02 DIAGNOSIS — G8929 Other chronic pain: Secondary | ICD-10-CM

## 2020-04-02 DIAGNOSIS — Z8669 Personal history of other diseases of the nervous system and sense organs: Secondary | ICD-10-CM

## 2020-04-02 DIAGNOSIS — Z9882 Breast implant status: Secondary | ICD-10-CM

## 2020-04-02 DIAGNOSIS — R202 Paresthesia of skin: Secondary | ICD-10-CM

## 2020-04-02 DIAGNOSIS — E782 Mixed hyperlipidemia: Secondary | ICD-10-CM

## 2020-04-02 DIAGNOSIS — R748 Abnormal levels of other serum enzymes: Secondary | ICD-10-CM

## 2020-04-02 DIAGNOSIS — G629 Polyneuropathy, unspecified: Secondary | ICD-10-CM

## 2020-04-28 ENCOUNTER — Ambulatory Visit: Payer: BLUE CROSS/BLUE SHIELD | Admitting: Rheumatology

## 2020-05-07 ENCOUNTER — Other Ambulatory Visit: Payer: Self-pay | Admitting: Family Medicine

## 2020-05-07 DIAGNOSIS — K219 Gastro-esophageal reflux disease without esophagitis: Secondary | ICD-10-CM

## 2020-06-16 ENCOUNTER — Other Ambulatory Visit: Payer: Self-pay | Admitting: Family Medicine

## 2020-06-16 DIAGNOSIS — K219 Gastro-esophageal reflux disease without esophagitis: Secondary | ICD-10-CM

## 2020-06-17 NOTE — Telephone Encounter (Signed)
Melanie Craig. NTBS 30 days given 05/07/20

## 2021-02-03 ENCOUNTER — Encounter (INDEPENDENT_AMBULATORY_CARE_PROVIDER_SITE_OTHER): Payer: Self-pay | Admitting: *Deleted

## 2021-03-15 ENCOUNTER — Encounter (INDEPENDENT_AMBULATORY_CARE_PROVIDER_SITE_OTHER): Payer: Self-pay | Admitting: Gastroenterology

## 2021-03-15 ENCOUNTER — Ambulatory Visit (INDEPENDENT_AMBULATORY_CARE_PROVIDER_SITE_OTHER): Payer: Medicaid Other | Admitting: Gastroenterology

## 2021-03-15 ENCOUNTER — Encounter (INDEPENDENT_AMBULATORY_CARE_PROVIDER_SITE_OTHER): Payer: Self-pay | Admitting: *Deleted

## 2021-09-27 DIAGNOSIS — M545 Low back pain, unspecified: Secondary | ICD-10-CM | POA: Diagnosis not present

## 2021-09-27 DIAGNOSIS — G894 Chronic pain syndrome: Secondary | ICD-10-CM | POA: Diagnosis not present

## 2021-10-31 DIAGNOSIS — M129 Arthropathy, unspecified: Secondary | ICD-10-CM | POA: Diagnosis not present

## 2022-02-11 DIAGNOSIS — D519 Vitamin B12 deficiency anemia, unspecified: Secondary | ICD-10-CM | POA: Diagnosis not present

## 2022-02-11 DIAGNOSIS — D529 Folate deficiency anemia, unspecified: Secondary | ICD-10-CM | POA: Diagnosis not present

## 2022-02-11 DIAGNOSIS — D649 Anemia, unspecified: Secondary | ICD-10-CM | POA: Diagnosis not present

## 2022-04-27 DIAGNOSIS — F411 Generalized anxiety disorder: Secondary | ICD-10-CM | POA: Diagnosis not present

## 2022-04-27 DIAGNOSIS — R69 Illness, unspecified: Secondary | ICD-10-CM | POA: Diagnosis not present

## 2022-04-28 ENCOUNTER — Ambulatory Visit: Payer: BLUE CROSS/BLUE SHIELD | Admitting: Internal Medicine

## 2022-06-09 ENCOUNTER — Inpatient Hospital Stay: Payer: Medicaid Other | Attending: Internal Medicine | Admitting: Genetic Counselor

## 2022-06-09 ENCOUNTER — Inpatient Hospital Stay: Payer: Medicaid Other

## 2022-06-09 DIAGNOSIS — Z806 Family history of leukemia: Secondary | ICD-10-CM | POA: Diagnosis not present

## 2022-06-09 DIAGNOSIS — Z803 Family history of malignant neoplasm of breast: Secondary | ICD-10-CM

## 2022-06-09 DIAGNOSIS — Z853 Personal history of malignant neoplasm of breast: Secondary | ICD-10-CM | POA: Diagnosis not present

## 2022-06-09 DIAGNOSIS — Z8042 Family history of malignant neoplasm of prostate: Secondary | ICD-10-CM

## 2022-06-09 DIAGNOSIS — Z808 Family history of malignant neoplasm of other organs or systems: Secondary | ICD-10-CM

## 2022-06-09 DIAGNOSIS — Z801 Family history of malignant neoplasm of trachea, bronchus and lung: Secondary | ICD-10-CM

## 2022-06-09 LAB — GENETIC SCREENING ORDER

## 2022-06-10 ENCOUNTER — Encounter: Payer: Self-pay | Admitting: Genetic Counselor

## 2022-06-10 NOTE — Progress Notes (Signed)
REFERRING PROVIDER: [place], MD [address] [place],   [address]  PRIMARY PROVIDER:  Terrell Nation, MD  PRIMARY REASON FOR VISIT:  Encounter Diagnoses  Name Primary?   Personal history of breast cancer Yes   Family history of breast cancer    Family history of prostate cancer     HISTORY OF PRESENT ILLNESS:   Melanie Craig, a 47 y.o. female, was seen for a [facility] cancer genetics consultation at the request of Dr. Jimmye Norman due to a personal and family history of cancer.  [name] presents to clinic today to discuss the possibility of a hereditary predisposition to cancer, to discuss genetic testing, and to further clarify her future cancer risks, as well as potential cancer risks for family members.   Melanie Craig reports she was diagnosed with breast cancer around age 75.   Past Medical History:  Diagnosis Date   Depression    Mixed hyperlipidemia 11/05/2019   PTSD (post-traumatic stress disorder)     Past Surgical History:  Procedure Laterality Date   ABDOMINAL HYSTERECTOMY     complete   CARPAL TUNNEL RELEASE      Social History   Socioeconomic History   Marital status: Divorced    Spouse name: Not on file   Number of children: Not on file   Years of education: Not on file   Highest education level: Not on file  Occupational History   Not on file  Tobacco Use   Smoking status: Every Day    Packs/day: 0.50    Years: 20.00    Total pack years: 10.00    Types: Cigarettes   Smokeless tobacco: Never  Vaping Use   Vaping Use: Never used  Substance and Sexual Activity   Alcohol use: No   Drug use: No   Sexual activity: Not on file  Other Topics Concern   Not on file  Social History Narrative   Not on file   Social Determinants of Health   Financial Resource Strain: Not on file  Food Insecurity: Not on file  Transportation Needs: Not on file  Physical Activity: Not on file  Stress: Not on file  Social Connections: Not on file      FAMILY HISTORY:  We obtained a detailed, 4-generation family history.  Significant diagnoses are listed below: Family History  Problem Relation Age of Onset   Hypothyroidism Mother    Dementia Mother    Hypercholesterolemia Mother    Throat cancer Father 72   Bipolar disorder Sister    Bipolar disorder Sister    Post-traumatic stress disorder Brother    Breast cancer Maternal Aunt 67 - 49   Liver cancer Maternal Uncle    Cancer Maternal Uncle        unknown type   Leukemia Maternal Uncle    Brain cancer Paternal Uncle    Lung cancer Paternal Uncle        smoked   Prostate cancer Maternal Grandfather        metastatic   Lung cancer Paternal Grandmother      Ms. Mastro's maternal aunt was diagnosed with breast cancer in her 66s. One maternal uncle was diagnosed with liver cancer, one maternal uncle was diagnosed with leukemia, and one maternal uncle was diagnosed with an unknown type of cancer, all three uncles are deceased. Melanie Craig maternal grandfather was diagnosed with metastatic prostate cancer, he is deceased.   Melanie Craig father was diagnosed with throat cancer at age  9. One paternal uncle was diagnosed with brain cancer and one paternal uncle was diagnosed with lung cancer, he smoked, they are both deceased. Her paternal grandmother was diagnosed with lung cancer, she smoked and is deceased.   Melanie Craig is unaware of previous family history of genetic testing for hereditary cancer risks. There is no reported Ashkenazi Jewish ancestry.   GENETIC COUNSELING ASSESSMENT: [name] is a 47 y.o. female with a personal and family history of cancer which is somewhat suggestive of a hereditary predisposition to cancer given her young age at diagnosis. We, therefore, discussed and recommended the following at today's visit.   DISCUSSION: We discussed that 5 - 10% of cancer is hereditary, with most cases of breast cancer associated with BRCA1/2.  There are other genes  that can be associated with hereditary breast cancer syndromes.  We discussed that testing is beneficial for several reasons including knowing how to follow individuals after completing their treatment, identifying whether potential treatment options  would be beneficial, and understanding if other family members could be at risk for cancer and allowing them to undergo genetic testing.   We reviewed the characteristics, features and inheritance patterns of hereditary cancer syndromes. We also discussed genetic testing, including the appropriate family members to test, the process of testing, insurance coverage and turn-around-time for results. We discussed the implications of a negative, positive, carrier and/or variant of uncertain significant result. We recommended Melanie Craig pursue genetic testing for a panel that includes genes associated with breast and prostate cancer.   Melanie Craig elected to have the [facility] Panel. The CancerNext gene panel offered by [employer] includes sequencing, rearrangement analysis, and RNA analysis for the following 36 genes:   APC, ATM, AXIN2, BARD1, BMPR1A, BRCA1, BRCA2, BRIP1, CDH1, CDK4, CDKN2A, CHEK2, DICER1, HOXB13, EPCAM, GREM1, MLH1, MSH2, MSH3, MSH6, MUTYH, NBN, NF1, NTHL1, PALB2, PMS2, POLD1, POLE, PTEN, RAD51C, RAD51D, RECQL, SMAD4, SMARCA4, STK11, and TP53.   Based on Melanie Craig's personal and family history of cancer, she meets medical criteria for genetic testing. Despite that she meets criteria, she may still have an out of pocket cost. We discussed that if her out of pocket cost for testing is over $100, the laboratory will call and confirm whether she wants to proceed with testing.  If the out of pocket cost of testing is less than $100 she will be billed by the genetic testing laboratory.   PLAN: After considering the risks, benefits, and limitations, Melanie Craig provided informed consent to pursue genetic testing and the blood sample was sent  to [facility] for analysis of the CancerNext Panel. Results should be available within approximately 2-3 weeks' time, at which point they will be disclosed by telephone to Ms. Chandonnet, as will any additional recommendations warranted by these results. Ms. Marques will receive a summary of her genetic counseling visit and a copy of her results once available. This information will also be available in Epic.   Ms. Prine questions were answered to her satisfaction today. Our contact information was provided should additional questions or concerns arise. Thank you for the referral and allowing [place] to share in the care of your patient.   Lucille Passy, MS, [facility] Genetic Counselor [email or web address] (P) [phone]  The patient was seen for a total of 30 minutes in face-to-face genetic counseling.  The patient brought her daughter. Drs. Lindi Adie and/or Burr Medico were available to discuss this case as needed.   _______________________________________________________________________ For Office Staff:  Number of people involved in  session: 2 Was an Intern/ student involved with case: no  

## 2022-06-24 ENCOUNTER — Telehealth: Payer: Self-pay | Admitting: Genetic Counselor

## 2022-06-24 ENCOUNTER — Encounter: Payer: Self-pay | Admitting: Genetic Counselor

## 2022-06-24 ENCOUNTER — Ambulatory Visit: Payer: Self-pay | Admitting: Genetic Counselor

## 2022-06-24 DIAGNOSIS — Z1379 Encounter for other screening for genetic and chromosomal anomalies: Secondary | ICD-10-CM | POA: Insufficient documentation

## 2022-06-24 NOTE — Telephone Encounter (Signed)
I attempted to contact Ms. Marbach to discuss her genetic testing results (34 genes). I left a voicemail requesting she call me back at [phone].  Lucille Passy, MS, [facility] Genetic Counselor [email or web address] (P) [phone]

## 2022-06-24 NOTE — Progress Notes (Signed)
HPI:   Melanie Craig was previously seen in the [facility] clinic due to a personal and family history of cancer and concerns regarding a hereditary predisposition to cancer. Please refer to our prior cancer genetics clinic note for more information regarding our discussion, assessment and recommendations, at the time. Melanie Craig recent genetic test results were disclosed to Melanie, as were recommendations warranted by these results. These results and recommendations are discussed in more detail below.  FAMILY HISTORY:  We obtained a detailed, 4-generation family history.  Significant diagnoses are listed below:      Family History  Problem Relation Age of Onset   Hypothyroidism Mother     Dementia Mother     Hypercholesterolemia Mother     Throat cancer Father 72   Bipolar disorder Sister     Bipolar disorder Sister     Post-traumatic stress disorder Brother     Breast cancer Maternal Aunt 38 - 49   Liver cancer Maternal Uncle     Cancer Maternal Uncle          unknown type   Leukemia Maternal Uncle     Brain cancer Paternal Uncle     Lung cancer Paternal Uncle          smoked   Prostate cancer Maternal Grandfather          metastatic   Lung cancer Paternal Grandmother         Melanie Craig's maternal aunt was diagnosed with breast cancer in Melanie 78s. One maternal uncle was diagnosed with liver cancer, one maternal uncle was diagnosed with leukemia, and one maternal uncle was diagnosed with an unknown type of cancer, all three uncles are deceased. Melanie Craig maternal grandfather was diagnosed with metastatic prostate cancer, he is deceased.    Melanie Craig father was diagnosed with throat cancer at age 63. One paternal uncle was diagnosed with brain cancer and one paternal uncle was diagnosed with lung cancer, he smoked, they are both deceased. Melanie paternal grandmother was diagnosed with lung cancer, she smoked and is deceased.    Melanie Craig is unaware of previous  family history of genetic testing for hereditary cancer risks. There is no reported Ashkenazi Jewish ancestry.   GENETIC TEST RESULTS:  The [facility] CancerNext Panel found no pathogenic mutations.   The CancerNext gene panel offered by [employer] includes sequencing, rearrangement analysis, and RNA analysis for the following 34 genes:   APC, ATM, AXIN2, BARD1, BMPR1A, BRCA1, BRCA2, BRIP1, CDH1, CDK4, CDKN2A, CHEK2, DICER1, HOXB13, EPCAM, GREM1, MLH1, MSH2, MSH3, MSH6, MUTYH, NF1, NTHL1, PALB2, PMS2, POLD1, POLE, PTEN, RAD51C, RAD51D, SMAD4, SMARCA4, STK11, and TP53.    The test report has been scanned into EPIC and is located under the Molecular Pathology section of the Results Review tab.  A portion of the result report is included below for reference. Genetic testing reported out on 06/18/2022.       Even though a pathogenic variant was not identified, possible explanations for the cancer in the family may include: There may be no hereditary risk for cancer in the family. The cancers in Melanie Craig and/or Melanie family may be due to other genetic or environmental factors. There may be a gene mutation in one of these genes that current testing methods cannot detect, but that chance is small. There could be another gene that has not yet been discovered, or that we have not yet tested, that is responsible for the cancer diagnoses in the family.  Therefore, it is important to remain in touch with cancer genetics in the future so that we can continue to offer Melanie Craig the most up to date genetic testing.   ADDITIONAL GENETIC TESTING:  We discussed with Melanie Craig that Melanie genetic testing was fairly extensive.  If there are genes identified to increase cancer risk that can be analyzed in the future, we would be happy to discuss and coordinate this testing at that time.    CANCER SCREENING RECOMMENDATIONS:  Melanie Craig test result is considered negative (normal).  This means that we have not  identified a hereditary cause for Melanie personal and family history of cancer at this time. An individual's cancer risk and medical management are not determined by genetic test results alone. Overall cancer risk assessment incorporates additional factors, including personal medical history, family history, and any available genetic information that may result in a personalized plan for cancer prevention and surveillance. Therefore, it is recommended she continue to follow the cancer management and screening guidelines provided by Melanie healthcare providers.  RECOMMENDATIONS FOR FAMILY MEMBERS:   Since she did not inherit a mutation in a cancer predisposition gene included on this panel, Melanie Craig could not have inherited a mutation from Melanie in one of these genes. Individuals in this family might be at some increased risk of developing cancer, over the general population risk, due to the family history of cancer. We recommend women in this family have a yearly mammogram beginning at age 34, or 86 years younger than the earliest onset of cancer, an annual clinical breast exam, and perform monthly breast self-exams.  FOLLOW-UP:  Cancer genetics is a rapidly advancing field and it is possible that new genetic tests will be appropriate for Melanie and/or Melanie family members in the future. We encouraged Melanie to remain in contact with cancer genetics on an annual basis so we can update Melanie personal and family histories and let Melanie know of advances in cancer genetics that may benefit this family.   Our contact number was provided. Melanie Craig questions were answered to Melanie satisfaction, and she knows she is welcome to call us at anytime with additional questions or concerns.   Lucille Passy, MS, [facility] Genetic Counselor [email or web address] (P) [phone]

## 2022-07-14 ENCOUNTER — Telehealth: Payer: Self-pay | Admitting: Genetic Counselor

## 2022-07-14 NOTE — Telephone Encounter (Signed)
I contacted Melanie Craig to discuss her genetic testing results. No pathogenic variants were identified in the 34 genes analyzed. Detailed clinic note to follow.  The test report has been scanned into EPIC and is located under the Molecular Pathology section of the Results Review tab.  A portion of the result report is included below for reference.   Lucille Passy, MS, [facility] Genetic Counselor [email or web address] (P) [phone]

## 2022-07-29 ENCOUNTER — Encounter: Payer: Self-pay | Admitting: Genetic Counselor

## 2023-01-26 ENCOUNTER — Telehealth: Payer: Self-pay | Admitting: Internal Medicine

## 2023-01-26 NOTE — Telephone Encounter (Signed)
Good Morning,  Patient is calling wishing to schedule a new patient appointment with you- awaiting your approval. Please advise on scheduling.   Thank you!

## 2023-01-31 NOTE — Telephone Encounter (Signed)
Pt number didn't work

## 2023-02-10 ENCOUNTER — Telehealth: Payer: Self-pay | Admitting: Internal Medicine

## 2023-02-10 NOTE — Telephone Encounter (Signed)
Need new patient approval

## 2023-02-13 NOTE — Telephone Encounter (Signed)
Left voicemail for patient to call office to schedule.

## 2023-07-17 DIAGNOSIS — N390 Urinary tract infection, site not specified: Secondary | ICD-10-CM | POA: Diagnosis not present
